# Patient Record
Sex: Female | Born: 1985 | Race: White | Hispanic: No | Marital: Single | Smoking: Never smoker
Health system: Southern US, Community
[De-identification: ages and names within clinical notes are randomized; demographics above are authoritative.]

## PROBLEM LIST (undated history)

## (undated) DIAGNOSIS — J45909 Unspecified asthma, uncomplicated: Secondary | ICD-10-CM

## (undated) DIAGNOSIS — F419 Anxiety disorder, unspecified: Secondary | ICD-10-CM

## (undated) DIAGNOSIS — Z808 Family history of malignant neoplasm of other organs or systems: Secondary | ICD-10-CM

## (undated) DIAGNOSIS — C439 Malignant melanoma of skin, unspecified: Secondary | ICD-10-CM

## (undated) DIAGNOSIS — F32A Depression, unspecified: Secondary | ICD-10-CM

## (undated) DIAGNOSIS — U099 Post covid-19 condition, unspecified: Secondary | ICD-10-CM

## (undated) DIAGNOSIS — F1910 Other psychoactive substance abuse, uncomplicated: Secondary | ICD-10-CM

## (undated) DIAGNOSIS — M3313 Other dermatomyositis without myopathy: Secondary | ICD-10-CM

## (undated) DIAGNOSIS — Z803 Family history of malignant neoplasm of breast: Secondary | ICD-10-CM

## (undated) DIAGNOSIS — J189 Pneumonia, unspecified organism: Secondary | ICD-10-CM

## (undated) HISTORY — DX: Family history of malignant neoplasm of breast: Z80.3

## (undated) HISTORY — DX: Unspecified asthma, uncomplicated: J45.909

## (undated) HISTORY — DX: Family history of malignant neoplasm of other organs or systems: Z80.8

## (undated) HISTORY — DX: Post covid-19 condition, unspecified: U09.9

## (undated) HISTORY — DX: Malignant melanoma of skin, unspecified: C43.9

## (undated) HISTORY — PX: OTHER SURGICAL HISTORY: SHX169

---
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
## 2004-07-24 ENCOUNTER — Other Ambulatory Visit: Admission: RE | Admit: 2004-07-24 | Discharge: 2004-07-24 | Payer: Self-pay | Admitting: Family Medicine

## 2005-08-26 ENCOUNTER — Other Ambulatory Visit: Admission: RE | Admit: 2005-08-26 | Discharge: 2005-08-26 | Payer: Self-pay | Admitting: *Deleted

## 2008-05-21 ENCOUNTER — Other Ambulatory Visit: Admission: RE | Admit: 2008-05-21 | Discharge: 2008-05-21 | Payer: Self-pay | Admitting: Family Medicine

## 2010-01-23 ENCOUNTER — Other Ambulatory Visit
Admission: RE | Admit: 2010-01-23 | Discharge: 2010-01-23 | Payer: Self-pay | Source: Home / Self Care | Admitting: Family Medicine

## 2010-01-23 ENCOUNTER — Other Ambulatory Visit: Payer: Self-pay

## 2010-12-22 DIAGNOSIS — M3320 Polymyositis, organ involvement unspecified: Secondary | ICD-10-CM | POA: Insufficient documentation

## 2010-12-22 DIAGNOSIS — C799 Secondary malignant neoplasm of unspecified site: Secondary | ICD-10-CM | POA: Insufficient documentation

## 2015-07-31 DIAGNOSIS — I890 Lymphedema, not elsewhere classified: Secondary | ICD-10-CM | POA: Insufficient documentation

## 2015-07-31 DIAGNOSIS — E8989 Other postprocedural endocrine and metabolic complications and disorders: Secondary | ICD-10-CM | POA: Insufficient documentation

## 2015-07-31 DIAGNOSIS — M3390 Dermatopolymyositis, unspecified, organ involvement unspecified: Secondary | ICD-10-CM | POA: Insufficient documentation

## 2015-07-31 DIAGNOSIS — R29898 Other symptoms and signs involving the musculoskeletal system: Secondary | ICD-10-CM | POA: Insufficient documentation

## 2015-09-12 DIAGNOSIS — M3390 Dermatopolymyositis, unspecified, organ involvement unspecified: Secondary | ICD-10-CM | POA: Diagnosis not present

## 2015-09-12 DIAGNOSIS — I890 Lymphedema, not elsewhere classified: Secondary | ICD-10-CM | POA: Diagnosis not present

## 2015-09-12 DIAGNOSIS — Z5181 Encounter for therapeutic drug level monitoring: Secondary | ICD-10-CM | POA: Diagnosis not present

## 2015-09-12 DIAGNOSIS — R296 Repeated falls: Secondary | ICD-10-CM | POA: Diagnosis not present

## 2015-09-12 DIAGNOSIS — Z85820 Personal history of malignant melanoma of skin: Secondary | ICD-10-CM | POA: Insufficient documentation

## 2015-09-12 DIAGNOSIS — Z7689 Persons encountering health services in other specified circumstances: Secondary | ICD-10-CM | POA: Diagnosis not present

## 2015-09-22 DIAGNOSIS — M3320 Polymyositis, organ involvement unspecified: Secondary | ICD-10-CM | POA: Diagnosis not present

## 2015-10-06 DIAGNOSIS — M3390 Dermatopolymyositis, unspecified, organ involvement unspecified: Secondary | ICD-10-CM | POA: Diagnosis not present

## 2015-10-06 DIAGNOSIS — M3320 Polymyositis, organ involvement unspecified: Secondary | ICD-10-CM | POA: Diagnosis not present

## 2015-10-15 DIAGNOSIS — R2689 Other abnormalities of gait and mobility: Secondary | ICD-10-CM | POA: Diagnosis not present

## 2015-10-15 DIAGNOSIS — R531 Weakness: Secondary | ICD-10-CM | POA: Diagnosis not present

## 2015-10-15 DIAGNOSIS — I890 Lymphedema, not elsewhere classified: Secondary | ICD-10-CM | POA: Diagnosis not present

## 2015-10-15 DIAGNOSIS — M3320 Polymyositis, organ involvement unspecified: Secondary | ICD-10-CM | POA: Diagnosis not present

## 2015-10-16 DIAGNOSIS — I890 Lymphedema, not elsewhere classified: Secondary | ICD-10-CM | POA: Diagnosis not present

## 2015-10-16 DIAGNOSIS — M3320 Polymyositis, organ involvement unspecified: Secondary | ICD-10-CM | POA: Diagnosis not present

## 2015-10-16 DIAGNOSIS — R2689 Other abnormalities of gait and mobility: Secondary | ICD-10-CM | POA: Diagnosis not present

## 2015-10-16 DIAGNOSIS — R531 Weakness: Secondary | ICD-10-CM | POA: Diagnosis not present

## 2015-10-27 DIAGNOSIS — I890 Lymphedema, not elsewhere classified: Secondary | ICD-10-CM | POA: Diagnosis not present

## 2015-10-27 DIAGNOSIS — R531 Weakness: Secondary | ICD-10-CM | POA: Diagnosis not present

## 2015-10-27 DIAGNOSIS — R2689 Other abnormalities of gait and mobility: Secondary | ICD-10-CM | POA: Diagnosis not present

## 2015-10-27 DIAGNOSIS — M3320 Polymyositis, organ involvement unspecified: Secondary | ICD-10-CM | POA: Diagnosis not present

## 2015-11-13 DIAGNOSIS — M3320 Polymyositis, organ involvement unspecified: Secondary | ICD-10-CM | POA: Diagnosis not present

## 2015-11-13 DIAGNOSIS — R262 Difficulty in walking, not elsewhere classified: Secondary | ICD-10-CM | POA: Diagnosis not present

## 2015-11-13 DIAGNOSIS — R2689 Other abnormalities of gait and mobility: Secondary | ICD-10-CM | POA: Diagnosis not present

## 2015-11-13 DIAGNOSIS — R531 Weakness: Secondary | ICD-10-CM | POA: Diagnosis not present

## 2015-11-17 DIAGNOSIS — R531 Weakness: Secondary | ICD-10-CM | POA: Diagnosis not present

## 2015-11-17 DIAGNOSIS — Z79899 Other long term (current) drug therapy: Secondary | ICD-10-CM | POA: Diagnosis not present

## 2015-11-17 DIAGNOSIS — Z23 Encounter for immunization: Secondary | ICD-10-CM | POA: Diagnosis not present

## 2015-11-17 DIAGNOSIS — M3390 Dermatopolymyositis, unspecified, organ involvement unspecified: Secondary | ICD-10-CM | POA: Diagnosis not present

## 2015-11-17 DIAGNOSIS — R899 Unspecified abnormal finding in specimens from other organs, systems and tissues: Secondary | ICD-10-CM | POA: Diagnosis not present

## 2015-11-17 DIAGNOSIS — I890 Lymphedema, not elsewhere classified: Secondary | ICD-10-CM | POA: Diagnosis not present

## 2015-11-18 ENCOUNTER — Telehealth: Payer: Self-pay | Admitting: Internal Medicine

## 2015-11-18 NOTE — Telephone Encounter (Signed)
LVM to inform patient. She could call back and set something with another doctor.

## 2015-11-18 NOTE — Telephone Encounter (Signed)
Patient is requesting to become a patient of yours. Please advise. Thank you.  °

## 2015-11-18 NOTE — Telephone Encounter (Signed)
Unfortunately I am not taking new patients right now. There are other providers at our office who are.

## 2015-11-19 DIAGNOSIS — M3320 Polymyositis, organ involvement unspecified: Secondary | ICD-10-CM | POA: Diagnosis not present

## 2015-11-19 DIAGNOSIS — R262 Difficulty in walking, not elsewhere classified: Secondary | ICD-10-CM | POA: Diagnosis not present

## 2015-11-19 DIAGNOSIS — R2689 Other abnormalities of gait and mobility: Secondary | ICD-10-CM | POA: Diagnosis not present

## 2015-11-19 DIAGNOSIS — R531 Weakness: Secondary | ICD-10-CM | POA: Diagnosis not present

## 2015-12-02 DIAGNOSIS — M3320 Polymyositis, organ involvement unspecified: Secondary | ICD-10-CM | POA: Diagnosis not present

## 2015-12-02 DIAGNOSIS — R531 Weakness: Secondary | ICD-10-CM | POA: Diagnosis not present

## 2015-12-02 DIAGNOSIS — R262 Difficulty in walking, not elsewhere classified: Secondary | ICD-10-CM | POA: Diagnosis not present

## 2015-12-02 DIAGNOSIS — R2689 Other abnormalities of gait and mobility: Secondary | ICD-10-CM | POA: Diagnosis not present

## 2015-12-09 DIAGNOSIS — Z85820 Personal history of malignant melanoma of skin: Secondary | ICD-10-CM | POA: Diagnosis not present

## 2015-12-09 DIAGNOSIS — M3390 Dermatopolymyositis, unspecified, organ involvement unspecified: Secondary | ICD-10-CM | POA: Diagnosis not present

## 2015-12-09 DIAGNOSIS — M3320 Polymyositis, organ involvement unspecified: Secondary | ICD-10-CM | POA: Diagnosis not present

## 2015-12-11 DIAGNOSIS — R2689 Other abnormalities of gait and mobility: Secondary | ICD-10-CM | POA: Diagnosis not present

## 2015-12-11 DIAGNOSIS — R531 Weakness: Secondary | ICD-10-CM | POA: Diagnosis not present

## 2015-12-11 DIAGNOSIS — R262 Difficulty in walking, not elsewhere classified: Secondary | ICD-10-CM | POA: Diagnosis not present

## 2015-12-11 DIAGNOSIS — M3320 Polymyositis, organ involvement unspecified: Secondary | ICD-10-CM | POA: Diagnosis not present

## 2015-12-15 DIAGNOSIS — R262 Difficulty in walking, not elsewhere classified: Secondary | ICD-10-CM | POA: Diagnosis not present

## 2015-12-15 DIAGNOSIS — R899 Unspecified abnormal finding in specimens from other organs, systems and tissues: Secondary | ICD-10-CM | POA: Diagnosis not present

## 2015-12-15 DIAGNOSIS — R2689 Other abnormalities of gait and mobility: Secondary | ICD-10-CM | POA: Diagnosis not present

## 2015-12-15 DIAGNOSIS — R531 Weakness: Secondary | ICD-10-CM | POA: Diagnosis not present

## 2015-12-15 DIAGNOSIS — R29898 Other symptoms and signs involving the musculoskeletal system: Secondary | ICD-10-CM | POA: Diagnosis not present

## 2015-12-26 DIAGNOSIS — R531 Weakness: Secondary | ICD-10-CM | POA: Diagnosis not present

## 2015-12-26 DIAGNOSIS — R2689 Other abnormalities of gait and mobility: Secondary | ICD-10-CM | POA: Diagnosis not present

## 2015-12-26 DIAGNOSIS — R262 Difficulty in walking, not elsewhere classified: Secondary | ICD-10-CM | POA: Diagnosis not present

## 2015-12-26 DIAGNOSIS — R29898 Other symptoms and signs involving the musculoskeletal system: Secondary | ICD-10-CM | POA: Diagnosis not present

## 2015-12-29 DIAGNOSIS — F4323 Adjustment disorder with mixed anxiety and depressed mood: Secondary | ICD-10-CM | POA: Diagnosis not present

## 2015-12-29 DIAGNOSIS — R531 Weakness: Secondary | ICD-10-CM | POA: Diagnosis not present

## 2015-12-29 DIAGNOSIS — R2689 Other abnormalities of gait and mobility: Secondary | ICD-10-CM | POA: Diagnosis not present

## 2015-12-29 DIAGNOSIS — R262 Difficulty in walking, not elsewhere classified: Secondary | ICD-10-CM | POA: Diagnosis not present

## 2015-12-29 DIAGNOSIS — R29898 Other symptoms and signs involving the musculoskeletal system: Secondary | ICD-10-CM | POA: Diagnosis not present

## 2016-01-02 DIAGNOSIS — R29898 Other symptoms and signs involving the musculoskeletal system: Secondary | ICD-10-CM | POA: Diagnosis not present

## 2016-01-02 DIAGNOSIS — R531 Weakness: Secondary | ICD-10-CM | POA: Diagnosis not present

## 2016-01-02 DIAGNOSIS — R2689 Other abnormalities of gait and mobility: Secondary | ICD-10-CM | POA: Diagnosis not present

## 2016-01-02 DIAGNOSIS — R262 Difficulty in walking, not elsewhere classified: Secondary | ICD-10-CM | POA: Diagnosis not present

## 2016-01-08 DIAGNOSIS — M3320 Polymyositis, organ involvement unspecified: Secondary | ICD-10-CM | POA: Diagnosis not present

## 2016-01-08 DIAGNOSIS — F4323 Adjustment disorder with mixed anxiety and depressed mood: Secondary | ICD-10-CM | POA: Diagnosis not present

## 2016-01-08 DIAGNOSIS — C799 Secondary malignant neoplasm of unspecified site: Secondary | ICD-10-CM | POA: Diagnosis not present

## 2016-01-09 DIAGNOSIS — R29898 Other symptoms and signs involving the musculoskeletal system: Secondary | ICD-10-CM | POA: Diagnosis not present

## 2016-01-09 DIAGNOSIS — R262 Difficulty in walking, not elsewhere classified: Secondary | ICD-10-CM | POA: Diagnosis not present

## 2016-01-09 DIAGNOSIS — R2689 Other abnormalities of gait and mobility: Secondary | ICD-10-CM | POA: Diagnosis not present

## 2016-01-09 DIAGNOSIS — R531 Weakness: Secondary | ICD-10-CM | POA: Diagnosis not present

## 2016-01-19 DIAGNOSIS — R262 Difficulty in walking, not elsewhere classified: Secondary | ICD-10-CM | POA: Diagnosis not present

## 2016-01-19 DIAGNOSIS — R2689 Other abnormalities of gait and mobility: Secondary | ICD-10-CM | POA: Diagnosis not present

## 2016-01-19 DIAGNOSIS — F4323 Adjustment disorder with mixed anxiety and depressed mood: Secondary | ICD-10-CM | POA: Diagnosis not present

## 2016-01-19 DIAGNOSIS — M3320 Polymyositis, organ involvement unspecified: Secondary | ICD-10-CM | POA: Diagnosis not present

## 2016-01-19 DIAGNOSIS — R29898 Other symptoms and signs involving the musculoskeletal system: Secondary | ICD-10-CM | POA: Diagnosis not present

## 2016-01-22 ENCOUNTER — Other Ambulatory Visit (INDEPENDENT_AMBULATORY_CARE_PROVIDER_SITE_OTHER): Payer: BLUE CROSS/BLUE SHIELD

## 2016-01-22 ENCOUNTER — Encounter: Payer: Self-pay | Admitting: Internal Medicine

## 2016-01-22 ENCOUNTER — Ambulatory Visit (INDEPENDENT_AMBULATORY_CARE_PROVIDER_SITE_OTHER): Payer: BLUE CROSS/BLUE SHIELD | Admitting: Internal Medicine

## 2016-01-22 VITALS — BP 118/84 | HR 74 | Temp 98.6°F | Resp 16 | Ht 66.0 in | Wt 178.0 lb

## 2016-01-22 DIAGNOSIS — M3320 Polymyositis, organ involvement unspecified: Secondary | ICD-10-CM

## 2016-01-22 DIAGNOSIS — Z0000 Encounter for general adult medical examination without abnormal findings: Secondary | ICD-10-CM | POA: Diagnosis not present

## 2016-01-22 DIAGNOSIS — C439 Malignant melanoma of skin, unspecified: Secondary | ICD-10-CM

## 2016-01-22 DIAGNOSIS — Z114 Encounter for screening for human immunodeficiency virus [HIV]: Secondary | ICD-10-CM | POA: Diagnosis not present

## 2016-01-22 DIAGNOSIS — R29898 Other symptoms and signs involving the musculoskeletal system: Secondary | ICD-10-CM | POA: Diagnosis not present

## 2016-01-22 DIAGNOSIS — C799 Secondary malignant neoplasm of unspecified site: Secondary | ICD-10-CM

## 2016-01-22 DIAGNOSIS — Z23 Encounter for immunization: Secondary | ICD-10-CM

## 2016-01-22 DIAGNOSIS — R262 Difficulty in walking, not elsewhere classified: Secondary | ICD-10-CM | POA: Diagnosis not present

## 2016-01-22 DIAGNOSIS — E8989 Other postprocedural endocrine and metabolic complications and disorders: Secondary | ICD-10-CM

## 2016-01-22 DIAGNOSIS — B379 Candidiasis, unspecified: Secondary | ICD-10-CM

## 2016-01-22 DIAGNOSIS — I890 Lymphedema, not elsewhere classified: Secondary | ICD-10-CM

## 2016-01-22 DIAGNOSIS — R2689 Other abnormalities of gait and mobility: Secondary | ICD-10-CM | POA: Diagnosis not present

## 2016-01-22 LAB — LIPID PANEL
CHOLESTEROL: 184 mg/dL (ref 0–200)
HDL: 90.8 mg/dL (ref >39.00)
LDL CALC: 82 mg/dL (ref 0–99)
NonHDL: 93.63
Total CHOL/HDL Ratio: 2
Triglycerides: 60 mg/dL (ref 0.0–149.0)
VLDL: 12 mg/dL (ref 0.0–40.0)

## 2016-01-22 LAB — COMPREHENSIVE METABOLIC PANEL
ALBUMIN: 4.5 g/dL (ref 3.5–5.2)
ALT: 22 U/L (ref 0–35)
AST: 19 U/L (ref 0–37)
Alkaline Phosphatase: 33 U/L — ABNORMAL LOW (ref 39–117)
BUN: 10 mg/dL (ref 6–23)
CALCIUM: 9.7 mg/dL (ref 8.4–10.5)
CHLORIDE: 103 meq/L (ref 96–112)
CO2: 29 mEq/L (ref 19–32)
Creatinine, Ser: 0.43 mg/dL (ref 0.40–1.20)
GFR: 180.88 mL/min (ref >60.00)
Glucose, Bld: 86 mg/dL (ref 70–99)
POTASSIUM: 3.5 meq/L (ref 3.5–5.1)
SODIUM: 140 meq/L (ref 135–145)
Total Bilirubin: 0.5 mg/dL (ref 0.2–1.2)
Total Protein: 7.2 g/dL (ref 6.0–8.3)

## 2016-01-22 LAB — CBC WITH DIFFERENTIAL/PLATELET
BASOS PCT: 0.2 % (ref 0.0–3.0)
Basophils Absolute: 0 10*3/uL (ref 0.0–0.1)
EOS PCT: 0.2 % (ref 0.0–5.0)
Eosinophils Absolute: 0 10*3/uL (ref 0.0–0.7)
HCT: 38.5 % (ref 36.0–46.0)
HEMOGLOBIN: 13.2 g/dL (ref 12.0–15.0)
Lymphocytes Relative: 10.9 % — ABNORMAL LOW (ref 12.0–46.0)
Lymphs Abs: 1.6 10*3/uL (ref 0.7–4.0)
MCHC: 34.3 g/dL (ref 30.0–36.0)
MCV: 84.8 fl (ref 78.0–100.0)
MONO ABS: 0.6 10*3/uL (ref 0.1–1.0)
Monocytes Relative: 4.5 % (ref 3.0–12.0)
Neutro Abs: 12 10*3/uL — ABNORMAL HIGH (ref 1.4–7.7)
Neutrophils Relative %: 84.2 % — ABNORMAL HIGH (ref 43.0–77.0)
Platelets: 295 10*3/uL (ref 150.0–400.0)
RBC: 4.53 Mil/uL (ref 3.87–5.11)
RDW: 13.8 % (ref 11.5–15.5)
WBC: 14.2 10*3/uL — AB (ref 4.0–10.5)

## 2016-01-22 LAB — TSH: TSH: 2.36 u[IU]/mL (ref 0.35–4.50)

## 2016-01-22 MED ORDER — PROBIOTIC ACIDOPHILUS PO CAPS: 1.0000 | ORAL_CAPSULE | Freq: Every day | ORAL | Status: AC

## 2016-01-22 NOTE — Assessment & Plan Note (Signed)
Related to lymph node removal for melanoma Following at [facility] lymphedema clinic

## 2016-01-22 NOTE — Assessment & Plan Note (Signed)
Recurrent infections Will discuss with derm when she sees them next month Will likely need diflucan for prophylaxis Working on a candidiasis diet

## 2016-01-22 NOTE — Assessment & Plan Note (Signed)
Following with [facility] neurology On prednisone - dose being tapered slowly Started Rituxan August 2017 Currently on prednisone 30 mg daily Doing PT - water therapy at [facility]

## 2016-01-22 NOTE — Progress Notes (Signed)
Pre visit review using our clinic review tool, if applicable. No additional management support is needed unless otherwise documented below in the visit note. 

## 2016-01-22 NOTE — Patient Instructions (Addendum)
[facility] [address] [place], [place] [address] [place]  [place], [place]  [address] [place], [place] [address] [phone]    Test(s) ordered today. Your results will be released to [place] (or called to you) after review, usually within 72hours after test completion. If any changes need to be made, you will be notified at that same time.  All other Health Maintenance issues reviewed.   All recommended immunizations and age-appropriate screenings are up-to-date or discussed.  tdap immunization administered today.   Medications reviewed and updated.   No changes recommended at this time.    Please followup in one year, sooner if needed

## 2016-01-22 NOTE — Progress Notes (Signed)
Subjective:    Patient ID: Patricia Blevins, female    DOB: 1985-12-17, 31 y.o.   MRN: [id number]  HPI She is here to establish with a new pcp.   She is here for a physical exam.    She moved back to the area in July 2017 - she grew up here.    Dermatomyositis/ polymyositis:  Diagnosed at age 23. Unclear if it is dermatomyositis or polymyositis.  Has been on prednisone since then.  She did IVIG as a teenager, but had severe headaches and did not work well.  She was on imuran and other immunosuppressants.  She is doing Rituxan injections - has only had one injection (August 2017) - was on 50 mg on prednisone and has been able to taper down.  She is currently following with [facility].  She is on 30 mg of prednisone daily. She has weakness in her proximal muscles.    Melanoma:  She was diagnosed around 2008-2010 - it was in her shoulder blades.  She had her lymph nodes removed in her right axilla and som in the left axilla.   She has lymphedema in her right arm.  It is always there.  She did PT in [place].  She has a pump at home and has not been using it daily.  She did not tolerate the sleeve.  She [facility] PT [facility] is [facility] therapy.  She has mild lymphedema in left arm. She will see derm next month.  There has been no evidence of recurrence.    She gets yeast infections/UTIs after sex, no matter what she does.   What she was getting in [place] was not helping - she was becoming resistent.  She saw ID in [place].  She has not had an recent infections. .   She has had intermittent yeast infections.   Working on an anti-inflammatory diet and low yeast diet.     She is doing PT 2/week at [facility] - mainly water PT.  She denies pain. She has significant weakness in her proximal muscles - she does feel her weakness is improving since starting PT.       Medications and allergies reviewed with patient and updated if appropriate.  Patient Active Problem List   Diagnosis Date  Noted  . Candidiasis 01/22/2016  . History of melanoma 09/12/2015  . Lymphedema of upper extremity following lymphadenectomy 07/31/2015  . Leg weakness, bilateral 07/31/2015  . Metastatic melanoma ([place]) 12/22/2010  . PM (polymyositis) ([place]) 12/22/2010    No current outpatient prescriptions on file prior to visit.   No current facility-administered medications on file prior to visit.     Past Medical History:  Diagnosis Date  . Asthma   . Melanoma [facility])     Past Surgical History:  Procedure Laterality Date  . lymph node removal    . melanoma removal  2008-2009    Social History   Social History  . Marital status: Single    Spouse name: N/A  . Number of children: N/A  . Years of education: N/A   Social History Main Topics  . Smoking status: Never Smoker  . Smokeless tobacco: Never Used  . Alcohol use Yes     Comment: occ  . Drug use: No     Comment: in 20's used marijuana and cocaine  . Sexual activity: Not Asked   Other Topics Concern  . None   Social History Narrative  . None  Family History  Problem Relation Age of Onset  . Arthritis Mother   . Hypertension Mother   . Arthritis Father   . Hypertension Brother   . Arthritis Maternal Grandmother   . Stroke Maternal Grandmother   . Arthritis Maternal Grandfather   . Melanoma Maternal Grandfather   . Cancer Paternal Grandmother     breast  . Diabetes Paternal Grandmother     Review of Systems  Constitutional: Negative for chills and fever.  HENT: Negative for hearing loss.   Eyes: Negative for visual disturbance.  Respiratory: Negative for cough, shortness of breath and wheezing.   Cardiovascular: Negative for chest pain, palpitations and leg swelling.  Gastrointestinal: Negative for abdominal pain, constipation, diarrhea and nausea.       No gerd  Genitourinary: Negative for dysuria and hematuria.  Musculoskeletal: Positive for arthralgias (right hip). Negative for back pain and myalgias.        Muscle weakness - proximal  Skin: Positive for rash (intermittent - yeast rashes).  Neurological: Positive for light-headedness. Negative for dizziness and headaches.  Psychiatric/Behavioral: Negative for dysphoric mood. The patient is not nervous/anxious.        Objective:   Vitals:   01/22/16 0902  BP: 118/84  Pulse: 74  Resp: 16  Temp: 98.6 F (37 C)   Filed Weights   01/22/16 0902  Weight: 178 lb (80.7 kg)   Body mass index is 28.73 kg/m.  Wt Readings from Last 3 Encounters:  01/22/16 178 lb (80.7 kg)     Physical Exam Constitutional: She appears well-developed and well-nourished. No distress.  HENT:  Head: Normocephalic and atraumatic.  Right Ear: External ear normal. Normal ear canal and TM Left Ear: External ear normal.  Normal ear canal and TM Mouth/Throat: Oropharynx is clear and moist.  Eyes: Conjunctivae and EOM are normal.  Neck: Neck supple. No tracheal deviation present. No thyromegaly present.  No carotid bruit  Cardiovascular: Normal rate, regular rhythm and normal heart sounds.   No murmur heard.  No edema. Pulmonary/Chest: Effort normal and breath sounds normal. No respiratory distress. She has no wheezes. She has no rales.  Breast: deferred to Gyn Abdominal: Soft. She exhibits no distension. There is no tenderness.  Msk: muscle weakness in proximal muscles of arms/legs Lymphadenopathy: She has no cervical adenopathy.  Skin: Skin is warm and dry. She is not diaphoretic.  Psychiatric: She has a normal mood and affect. Her behavior is normal.         Assessment & Plan:   Physical exam: Screening blood work ordered Immunizations tdap today, had flu vaccine Gyn - has not seen one in 3 years - will give info for new gyn Eye exams  Up to date  Exercise  - doing PT - working on increasing activity Weight - overweight, but limited in activity Skin rashes on face and body - suggestive of candidiasis - intermittent - sees derm next week - will  discuss with them Substance abuse - none  See Problem List for Assessment and Plan of chronic medical problems.   FU annually, sooner if needed

## 2016-01-22 NOTE — Assessment & Plan Note (Signed)
Sees dermatology No evidence of recurrence

## 2016-01-23 LAB — HIV ANTIBODY (ROUTINE TESTING W REFLEX): HIV: NONREACTIVE

## 2016-01-26 ENCOUNTER — Encounter: Payer: Self-pay | Admitting: Obstetrics and Gynecology

## 2016-01-26 ENCOUNTER — Encounter: Payer: BLUE CROSS/BLUE SHIELD | Admitting: Obstetrics and Gynecology

## 2016-02-02 DIAGNOSIS — Z79899 Other long term (current) drug therapy: Secondary | ICD-10-CM | POA: Diagnosis not present

## 2016-02-02 DIAGNOSIS — M3390 Dermatopolymyositis, unspecified, organ involvement unspecified: Secondary | ICD-10-CM | POA: Diagnosis not present

## 2016-02-04 DIAGNOSIS — F4323 Adjustment disorder with mixed anxiety and depressed mood: Secondary | ICD-10-CM | POA: Diagnosis not present

## 2016-02-06 DIAGNOSIS — M3320 Polymyositis, organ involvement unspecified: Secondary | ICD-10-CM | POA: Diagnosis not present

## 2016-02-06 DIAGNOSIS — Z713 Dietary counseling and surveillance: Secondary | ICD-10-CM | POA: Diagnosis not present

## 2016-02-06 DIAGNOSIS — Z85820 Personal history of malignant melanoma of skin: Secondary | ICD-10-CM | POA: Diagnosis not present

## 2016-02-06 DIAGNOSIS — R2689 Other abnormalities of gait and mobility: Secondary | ICD-10-CM | POA: Diagnosis not present

## 2016-02-06 DIAGNOSIS — R262 Difficulty in walking, not elsewhere classified: Secondary | ICD-10-CM | POA: Diagnosis not present

## 2016-02-06 DIAGNOSIS — R29898 Other symptoms and signs involving the musculoskeletal system: Secondary | ICD-10-CM | POA: Diagnosis not present

## 2016-02-09 DIAGNOSIS — M3320 Polymyositis, organ involvement unspecified: Secondary | ICD-10-CM | POA: Diagnosis not present

## 2016-02-09 DIAGNOSIS — R29898 Other symptoms and signs involving the musculoskeletal system: Secondary | ICD-10-CM | POA: Diagnosis not present

## 2016-02-09 DIAGNOSIS — R2689 Other abnormalities of gait and mobility: Secondary | ICD-10-CM | POA: Diagnosis not present

## 2016-02-09 DIAGNOSIS — R262 Difficulty in walking, not elsewhere classified: Secondary | ICD-10-CM | POA: Diagnosis not present

## 2016-02-11 DIAGNOSIS — Z124 Encounter for screening for malignant neoplasm of cervix: Secondary | ICD-10-CM | POA: Diagnosis not present

## 2016-02-11 DIAGNOSIS — N760 Acute vaginitis: Secondary | ICD-10-CM | POA: Diagnosis not present

## 2016-02-11 DIAGNOSIS — Z1151 Encounter for screening for human papillomavirus (HPV): Secondary | ICD-10-CM | POA: Diagnosis not present

## 2016-02-11 DIAGNOSIS — Z1389 Encounter for screening for other disorder: Secondary | ICD-10-CM | POA: Diagnosis not present

## 2016-02-11 DIAGNOSIS — Z01419 Encounter for gynecological examination (general) (routine) without abnormal findings: Secondary | ICD-10-CM | POA: Diagnosis not present

## 2016-02-11 DIAGNOSIS — Z6827 Body mass index (BMI) 27.0-27.9, adult: Secondary | ICD-10-CM | POA: Diagnosis not present

## 2016-02-11 DIAGNOSIS — R87610 Atypical squamous cells of undetermined significance on cytologic smear of cervix (ASC-US): Secondary | ICD-10-CM | POA: Diagnosis not present

## 2016-02-11 DIAGNOSIS — N898 Other specified noninflammatory disorders of vagina: Secondary | ICD-10-CM | POA: Diagnosis not present

## 2016-02-13 DIAGNOSIS — F4323 Adjustment disorder with mixed anxiety and depressed mood: Secondary | ICD-10-CM | POA: Diagnosis not present

## 2016-02-18 DIAGNOSIS — F4323 Adjustment disorder with mixed anxiety and depressed mood: Secondary | ICD-10-CM | POA: Diagnosis not present

## 2016-02-24 DIAGNOSIS — F4323 Adjustment disorder with mixed anxiety and depressed mood: Secondary | ICD-10-CM | POA: Diagnosis not present

## 2016-02-25 ENCOUNTER — Encounter: Payer: Self-pay | Admitting: Family Medicine

## 2016-02-25 ENCOUNTER — Ambulatory Visit (INDEPENDENT_AMBULATORY_CARE_PROVIDER_SITE_OTHER): Payer: BLUE CROSS/BLUE SHIELD | Admitting: Family Medicine

## 2016-02-25 VITALS — BP 137/83 | HR 85 | Temp 98.7°F | Resp 16 | Ht 66.0 in | Wt 178.0 lb

## 2016-02-25 DIAGNOSIS — Z30019 Encounter for initial prescription of contraceptives, unspecified: Secondary | ICD-10-CM | POA: Diagnosis not present

## 2016-02-25 DIAGNOSIS — M3320 Polymyositis, organ involvement unspecified: Secondary | ICD-10-CM | POA: Diagnosis not present

## 2016-02-25 LAB — POCT URINE PREGNANCY: Preg Test, Ur: NEGATIVE

## 2016-02-25 MED ORDER — NORETHINDRONE 0.35 MG PO TABS: 1.0000 | ORAL_TABLET | Freq: Every day | ORAL | 11 refills | Status: DC

## 2016-02-25 NOTE — Patient Instructions (Signed)
     IF you received an x-ray today, you will receive an invoice from [facility]. Please contact [facility] at [phone] with questions or concerns regarding your invoice.   IF you received labwork today, you will receive an invoice from [facility]. Please contact [facility] at [phone] with questions or concerns regarding your invoice.   Our billing staff will not be able to assist you with questions regarding bills from these companies.  You will be contacted with the lab results as soon as they are available. The fastest way to get your results is to activate your My Chart account. Instructions are located on the last page of this paperwork. If you have not heard from us regarding the results in 2 weeks, please contact this office.     

## 2016-02-25 NOTE — Progress Notes (Signed)
Chief Complaint  Patient presents with  . Contraception  . Establish Care    HPI   Pt reports that she has polymyositis and has been on prednisone chronically since age 31 She reports that her polymyositis is managed by [facility]  Contraception  Patient's last menstrual period was 01/14/2016. Pt reports that she has tried OCPs in the past but stopped due to changes in weight which could have also been due to prednisone She has considered implant, IUD, nuvaring or pills She reports she is concerned about how it would affect   Past Medical History:  Diagnosis Date  . Asthma   . Melanoma [facility])     Current Outpatient Prescriptions  Medication Sig Dispense Refill  . Calcium Carb-Cholecalciferol (CALCIUM-VITAMIN D) 600-400 MG-UNIT TABS Take by mouth daily.    . Lactobacillus (PROBIOTIC ACIDOPHILUS) CAPS Take 1 capsule by mouth daily.    . predniSONE (DELTASONE) 10 MG tablet Take 30 mg by mouth daily with breakfast.    . norethindrone (ORTHO MICRONOR) 0.35 MG tablet Take 1 tablet (0.35 mg total) by mouth daily. 1 Package 11   No current facility-administered medications for this visit.     Allergies: No Known Allergies  Past Surgical History:  Procedure Laterality Date  . lymph node removal    . melanoma removal  2008-2009    Social History   Social History  . Marital status: Single    Spouse name: N/A  . Number of children: N/A  . Years of education: N/A   Social History Main Topics  . Smoking status: Never Smoker  . Smokeless tobacco: Never Used  . Alcohol use Yes     Comment: occ  . Drug use: No     Comment: in 20's used marijuana and cocaine  . Sexual activity: Not Asked   Other Topics Concern  . None   Social History Narrative  . None    Review of Systems  Constitutional: Negative for chills and fever.  Respiratory: Negative for cough, shortness of breath and wheezing.   Cardiovascular: Negative for chest pain, palpitations and orthopnea.    Musculoskeletal: Positive for back pain, joint pain and myalgias.  Skin: Negative for itching and rash.  Neurological: Positive for headaches. Negative for dizziness and tingling.       Has sinus headaches    Objective: Vitals:   02/25/16 0855  BP: 137/83  Pulse: 85  Resp: 16  Temp: 98.7 F (37.1 C)  TempSrc: Oral  SpO2: 100%  Weight: 178 lb (80.7 kg)  Height: 5\' 6"  (1.676 m)  Body mass index is 28.73 kg/m.   Physical Exam  Constitutional: She is oriented to person, place, and time. She appears well-developed and well-nourished.  HENT:  Head: Normocephalic and atraumatic.  Eyes: Conjunctivae and EOM are normal.  Neck: Normal range of motion. Neck supple.  Cardiovascular: Normal rate, regular rhythm and normal heart sounds.   Pulmonary/Chest: Effort normal and breath sounds normal. No respiratory distress.  Neurological: She is alert and oriented to person, place, and time.  Skin: Skin is warm. No erythema.  Psychiatric: She has a normal mood and affect. Her behavior is normal. Judgment and thought content normal.    Assessment and Duarte was seen today for contraception and establish care.  Diagnoses and all orders for this visit:  Encounter for initial prescription of contraceptives, unspecified contraceptive -     POCT urine pregnancy -     norethindrone (ORTHO MICRONOR) 0.35 MG tablet; Take 1  tablet (0.35 mg total) by mouth daily.  Pt advised to follow up with Janann August for her IUD but for now sent her micronor  Polymyositis [facility]) For her polymyositis continue at [facility]

## 2016-03-05 DIAGNOSIS — D2270 Melanocytic nevi of unspecified lower limb, including hip: Secondary | ICD-10-CM

## 2016-03-05 DIAGNOSIS — L710 Perioral dermatitis: Secondary | ICD-10-CM | POA: Diagnosis not present

## 2016-03-05 DIAGNOSIS — Z7960 Long term (current) use of unspecified immunomodulators and immunosuppressants: Secondary | ICD-10-CM | POA: Insufficient documentation

## 2016-03-05 DIAGNOSIS — Z79899 Other long term (current) drug therapy: Secondary | ICD-10-CM | POA: Diagnosis not present

## 2016-03-05 DIAGNOSIS — D225 Melanocytic nevi of trunk: Secondary | ICD-10-CM | POA: Insufficient documentation

## 2016-03-05 DIAGNOSIS — D2260 Melanocytic nevi of unspecified upper limb, including shoulder: Secondary | ICD-10-CM

## 2016-03-05 DIAGNOSIS — D1801 Hemangioma of skin and subcutaneous tissue: Secondary | ICD-10-CM | POA: Diagnosis not present

## 2016-03-19 DIAGNOSIS — R739 Hyperglycemia, unspecified: Secondary | ICD-10-CM | POA: Diagnosis not present

## 2016-03-19 DIAGNOSIS — Z79899 Other long term (current) drug therapy: Secondary | ICD-10-CM | POA: Diagnosis not present

## 2016-03-19 DIAGNOSIS — M3390 Dermatopolymyositis, unspecified, organ involvement unspecified: Secondary | ICD-10-CM | POA: Diagnosis not present

## 2016-03-19 DIAGNOSIS — T50905A Adverse effect of unspecified drugs, medicaments and biological substances, initial encounter: Secondary | ICD-10-CM | POA: Diagnosis not present

## 2016-03-19 DIAGNOSIS — M3320 Polymyositis, organ involvement unspecified: Secondary | ICD-10-CM | POA: Diagnosis not present

## 2016-03-23 ENCOUNTER — Telehealth: Payer: Self-pay | Admitting: Internal Medicine

## 2016-03-23 NOTE — Telephone Encounter (Signed)
Rec'd from [facility]DS forward 5 pages to Dr.Burns

## 2016-04-19 DIAGNOSIS — M3390 Dermatopolymyositis, unspecified, organ involvement unspecified: Secondary | ICD-10-CM | POA: Diagnosis not present

## 2016-04-19 DIAGNOSIS — M3320 Polymyositis, organ involvement unspecified: Secondary | ICD-10-CM | POA: Diagnosis not present

## 2016-05-03 DIAGNOSIS — M3320 Polymyositis, organ involvement unspecified: Secondary | ICD-10-CM | POA: Diagnosis not present

## 2016-05-03 DIAGNOSIS — M3390 Dermatopolymyositis, unspecified, organ involvement unspecified: Secondary | ICD-10-CM | POA: Diagnosis not present

## 2016-05-18 ENCOUNTER — Encounter: Payer: Self-pay | Admitting: Family Medicine

## 2016-05-18 ENCOUNTER — Ambulatory Visit (INDEPENDENT_AMBULATORY_CARE_PROVIDER_SITE_OTHER): Payer: BLUE CROSS/BLUE SHIELD | Admitting: Family Medicine

## 2016-05-18 ENCOUNTER — Ambulatory Visit (INDEPENDENT_AMBULATORY_CARE_PROVIDER_SITE_OTHER): Payer: BLUE CROSS/BLUE SHIELD

## 2016-05-18 VITALS — BP 142/90 | HR 83 | Temp 98.4°F | Resp 16 | Ht 66.0 in | Wt 171.8 lb

## 2016-05-18 DIAGNOSIS — R6883 Chills (without fever): Secondary | ICD-10-CM

## 2016-05-18 DIAGNOSIS — R062 Wheezing: Secondary | ICD-10-CM

## 2016-05-18 DIAGNOSIS — Z7960 Long term (current) use of unspecified immunomodulators and immunosuppressants: Secondary | ICD-10-CM

## 2016-05-18 DIAGNOSIS — R05 Cough: Secondary | ICD-10-CM | POA: Diagnosis not present

## 2016-05-18 DIAGNOSIS — M791 Myalgia, unspecified site: Secondary | ICD-10-CM

## 2016-05-18 DIAGNOSIS — Z79899 Other long term (current) drug therapy: Secondary | ICD-10-CM

## 2016-05-18 DIAGNOSIS — R058 Other specified cough: Secondary | ICD-10-CM

## 2016-05-18 MED ORDER — AZITHROMYCIN 250 MG PO TABS: ORAL_TABLET | ORAL | 0 refills | Status: DC

## 2016-05-18 MED ORDER — FLUTICASONE PROPIONATE 50 MCG/ACT NA SUSP: 2.0000 | Freq: Every day | NASAL | 6 refills | Status: DC

## 2016-05-18 NOTE — Patient Instructions (Signed)
     IF you received an x-ray today, you will receive an invoice from [facility]. Please contact [facility] at [phone] with questions or concerns regarding your invoice.   IF you received labwork today, you will receive an invoice from [facility]. Please contact [facility] at [phone] with questions or concerns regarding your invoice.   Our billing staff will not be able to assist you with questions regarding bills from these companies.  You will be contacted with the lab results as soon as they are available. The fastest way to get your results is to activate your My Chart account. Instructions are located on the last page of this paperwork. If you have not heard from [place] regarding the results in 2 weeks, please contact this office.     f

## 2016-05-18 NOTE — Progress Notes (Signed)
Chief Complaint  Patient presents with  . URI    nasal/chest congestion x 3 weeks yellow mucus/phlegm, few chills in the beginning     HPI  On April 23rd she had a retuxan infusions and was having sore throat, congestion She reports that she is coughing up yellow mucus Each day  Her symptoms are about the same  She states that she lives with a one year old who may have gotten her sick She reports myalgias, weakness and muscle pain She reports that she had chills and maybe one night of fevers Patient's last menstrual period was 05/18/2016.  She takes rituxan for polymyositis She states that she got sick almost immediately after her infusion She reports that this is her last infusion  Past Medical History:  Diagnosis Date  . Asthma   . Melanoma [facility])     Current Outpatient Prescriptions  Medication Sig Dispense Refill  . Calcium Carb-Cholecalciferol (CALCIUM-VITAMIN D) 600-400 MG-UNIT TABS Take by mouth daily.    . Lactobacillus (PROBIOTIC ACIDOPHILUS) CAPS Take 1 capsule by mouth daily.    . predniSONE (DELTASONE) 10 MG tablet Take 30 mg by mouth daily with breakfast.    . azithromycin (ZITHROMAX) 250 MG tablet Take 2 tablets today, one tablet each day after 6 tablet 0  . fluticasone (FLONASE) 50 MCG/ACT nasal spray Place 2 sprays into both nostrils daily. 16 g 6  . norethindrone (ORTHO MICRONOR) 0.35 MG tablet Take 1 tablet (0.35 mg total) by mouth daily. (Patient not taking: Reported on 05/18/2016) 1 Package 11   No current facility-administered medications for this visit.     Allergies: No Known Allergies  Past Surgical History:  Procedure Laterality Date  . lymph node removal    . melanoma removal  2008-2009    Social History   Social History  . Marital status: Single    Spouse name: N/A  . Number of children: N/A  . Years of education: N/A   Social History Main Topics  . Smoking status: Never Smoker  . Smokeless tobacco: Never Used  . Alcohol use Yes   Comment: occ  . Drug use: No     Comment: in 20's used marijuana and cocaine  . Sexual activity: Not Asked   Other Topics Concern  . None   Social History Narrative  . None    ROS  Objective: Vitals:   05/18/16 0908  BP: (!) 142/90  Pulse: 83  Resp: 16  Temp: 98.4 F (36.9 C)  TempSrc: Oral  SpO2: 96%  Weight: 171 lb 12.8 oz (77.9 kg)  Height: 5\' 6"  (1.676 m)   BP Readings from Last 3 Encounters:  05/18/16 (!) 142/90  02/25/16 137/83  01/22/16 118/84    Physical Exam General: alert, oriented, in NAD Head: normocephalic, atraumatic, no sinus tenderness Eyes: EOM intact, no scleral icterus or conjunctival injection Ears: TM clear bilaterally Nose: mucosa nonerythematous, nonedematous Throat: no pharyngeal exudate or erythema Lymph: no posterior auricular, submental or cervical lymph adenopathy Heart: normal rate, normal sinus rhythm, no murmurs Lungs: wheezing in the apical lung bilaterally, no crackles, no rhonchi  Independent review of CXR imagies with pt No acute cardiopulm manifestations, no effusions noted   Assessment and Plan [place] was seen today for uri.  Diagnoses and all orders for this visit:  Myalgia Chills -   If this persists return to arrange flu test Clinic does not have flu tests currently -     Cancel: POCT Influenza A/B -  DG Chest 2 View  Productive cough -     DG Chest 2 View  Wheezing Medical decision making Will treat for acute bronchitis Plan for flonase and zpak -     DG Chest 2 View -     fluticasone (FLONASE) 50 MCG/ACT nasal spray; Place 2 sprays into both nostrils daily. -     azithromycin (ZITHROMAX) 250 MG tablet; Take 2 tablets today, one tablet each day after  Long term immunosuppressant use Discussed that if her symptoms do not improve to return to clinic  Follow up prn  [place]

## 2016-06-21 DIAGNOSIS — Z79899 Other long term (current) drug therapy: Secondary | ICD-10-CM | POA: Diagnosis not present

## 2016-06-21 DIAGNOSIS — M3390 Dermatopolymyositis, unspecified, organ involvement unspecified: Secondary | ICD-10-CM | POA: Diagnosis not present

## 2016-06-21 DIAGNOSIS — Z006 Encounter for examination for normal comparison and control in clinical research program: Secondary | ICD-10-CM | POA: Diagnosis not present

## 2016-07-06 DIAGNOSIS — Z87440 Personal history of urinary (tract) infections: Secondary | ICD-10-CM | POA: Diagnosis not present

## 2016-07-06 DIAGNOSIS — R29898 Other symptoms and signs involving the musculoskeletal system: Secondary | ICD-10-CM | POA: Diagnosis not present

## 2016-07-06 DIAGNOSIS — M3320 Polymyositis, organ involvement unspecified: Secondary | ICD-10-CM | POA: Diagnosis not present

## 2016-07-06 DIAGNOSIS — I890 Lymphedema, not elsewhere classified: Secondary | ICD-10-CM | POA: Diagnosis not present

## 2016-07-06 DIAGNOSIS — R262 Difficulty in walking, not elsewhere classified: Secondary | ICD-10-CM | POA: Diagnosis not present

## 2016-07-06 DIAGNOSIS — J45909 Unspecified asthma, uncomplicated: Secondary | ICD-10-CM | POA: Diagnosis not present

## 2016-07-06 DIAGNOSIS — Z85820 Personal history of malignant melanoma of skin: Secondary | ICD-10-CM | POA: Diagnosis not present

## 2016-07-20 DIAGNOSIS — R262 Difficulty in walking, not elsewhere classified: Secondary | ICD-10-CM | POA: Diagnosis not present

## 2016-07-20 DIAGNOSIS — I890 Lymphedema, not elsewhere classified: Secondary | ICD-10-CM | POA: Diagnosis not present

## 2016-07-20 DIAGNOSIS — J45909 Unspecified asthma, uncomplicated: Secondary | ICD-10-CM | POA: Diagnosis not present

## 2016-07-20 DIAGNOSIS — R29898 Other symptoms and signs involving the musculoskeletal system: Secondary | ICD-10-CM | POA: Diagnosis not present

## 2016-07-20 DIAGNOSIS — M3320 Polymyositis, organ involvement unspecified: Secondary | ICD-10-CM | POA: Diagnosis not present

## 2016-07-20 DIAGNOSIS — Z87440 Personal history of urinary (tract) infections: Secondary | ICD-10-CM | POA: Diagnosis not present

## 2016-07-20 DIAGNOSIS — Z85820 Personal history of malignant melanoma of skin: Secondary | ICD-10-CM | POA: Diagnosis not present

## 2016-07-20 DIAGNOSIS — R2689 Other abnormalities of gait and mobility: Secondary | ICD-10-CM | POA: Diagnosis not present

## 2016-08-03 DIAGNOSIS — J45909 Unspecified asthma, uncomplicated: Secondary | ICD-10-CM | POA: Diagnosis not present

## 2016-08-03 DIAGNOSIS — M3320 Polymyositis, organ involvement unspecified: Secondary | ICD-10-CM | POA: Diagnosis not present

## 2016-08-03 DIAGNOSIS — Z85820 Personal history of malignant melanoma of skin: Secondary | ICD-10-CM | POA: Diagnosis not present

## 2016-08-03 DIAGNOSIS — R2689 Other abnormalities of gait and mobility: Secondary | ICD-10-CM | POA: Diagnosis not present

## 2016-08-03 DIAGNOSIS — R29898 Other symptoms and signs involving the musculoskeletal system: Secondary | ICD-10-CM | POA: Diagnosis not present

## 2016-08-03 DIAGNOSIS — I890 Lymphedema, not elsewhere classified: Secondary | ICD-10-CM | POA: Diagnosis not present

## 2016-08-03 DIAGNOSIS — Z87440 Personal history of urinary (tract) infections: Secondary | ICD-10-CM | POA: Diagnosis not present

## 2016-08-03 DIAGNOSIS — R262 Difficulty in walking, not elsewhere classified: Secondary | ICD-10-CM | POA: Diagnosis not present

## 2016-08-16 DIAGNOSIS — M3320 Polymyositis, organ involvement unspecified: Secondary | ICD-10-CM | POA: Diagnosis not present

## 2016-08-16 DIAGNOSIS — R262 Difficulty in walking, not elsewhere classified: Secondary | ICD-10-CM | POA: Diagnosis not present

## 2016-08-16 DIAGNOSIS — I890 Lymphedema, not elsewhere classified: Secondary | ICD-10-CM | POA: Diagnosis not present

## 2016-08-16 DIAGNOSIS — R29898 Other symptoms and signs involving the musculoskeletal system: Secondary | ICD-10-CM | POA: Diagnosis not present

## 2016-08-16 DIAGNOSIS — Z85820 Personal history of malignant melanoma of skin: Secondary | ICD-10-CM | POA: Diagnosis not present

## 2016-08-16 DIAGNOSIS — J45909 Unspecified asthma, uncomplicated: Secondary | ICD-10-CM | POA: Diagnosis not present

## 2016-08-16 DIAGNOSIS — Z87440 Personal history of urinary (tract) infections: Secondary | ICD-10-CM | POA: Diagnosis not present

## 2016-08-19 DIAGNOSIS — M3320 Polymyositis, organ involvement unspecified: Secondary | ICD-10-CM | POA: Diagnosis not present

## 2016-09-06 DIAGNOSIS — Z23 Encounter for immunization: Secondary | ICD-10-CM | POA: Diagnosis not present

## 2016-10-13 ENCOUNTER — Encounter: Payer: Self-pay | Admitting: Family Medicine

## 2016-10-13 ENCOUNTER — Ambulatory Visit (INDEPENDENT_AMBULATORY_CARE_PROVIDER_SITE_OTHER): Payer: BLUE CROSS/BLUE SHIELD | Admitting: Family Medicine

## 2016-10-13 VITALS — BP 104/68 | HR 95 | Temp 98.3°F | Resp 17 | Ht 66.0 in | Wt 178.2 lb

## 2016-10-13 DIAGNOSIS — J029 Acute pharyngitis, unspecified: Secondary | ICD-10-CM

## 2016-10-13 DIAGNOSIS — J069 Acute upper respiratory infection, unspecified: Secondary | ICD-10-CM

## 2016-10-13 NOTE — Progress Notes (Signed)
  Chief Complaint  Patient presents with  . Sore Throat    Onset: 10/09/16, woke up with scratchy sore throat, fatigued and just wanting to sleep, woke up this morning unable to talk, throat dry and scratchy, chest hurts, ha's (pressure), sweaty and cold, no temps noticed    HPI   Upper Respiratory Infection: Patient complains of symptoms of a URI. Symptoms include congestion. Onset of symptoms was 5 days ago, gradually worsening since that time.  She has been having a dry scratchy throat with some chest congestion, sweaty and cold but no fevers She is drinking plenty of fluids. Evaluation to date: none. Treatment to date: none.     Past Medical History:  Diagnosis Date  . Asthma   . Melanoma [facility])     Current Outpatient Prescriptions  Medication Sig Dispense Refill  . Calcium Carb-Cholecalciferol (CALCIUM-VITAMIN D) 600-400 MG-UNIT TABS Take by mouth daily.    . Lactobacillus (PROBIOTIC ACIDOPHILUS) CAPS Take 1 capsule by mouth daily.    . predniSONE (DELTASONE) 10 MG tablet Take 30 mg by mouth daily with breakfast.    . azithromycin (ZITHROMAX) 250 MG tablet Take 2 tablets today, one tablet each day after (Patient not taking: Reported on 10/13/2016) 6 tablet 0  . fluticasone (FLONASE) 50 MCG/ACT nasal spray Place 2 sprays into both nostrils daily. (Patient not taking: Reported on 10/13/2016) 16 g 6  . norethindrone (ORTHO MICRONOR) 0.35 MG tablet Take 1 tablet (0.35 mg total) by mouth daily. (Patient not taking: Reported on 05/18/2016) 1 Package 11   No current facility-administered medications for this visit.     Allergies: No Known Allergies  Past Surgical History:  Procedure Laterality Date  . lymph node removal    . melanoma removal  2008-2009    Social History   Social History  . Marital status: Single    Spouse name: N/A  . Number of children: N/A  . Years of education: N/A   Social History Main Topics  . Smoking status: Never Smoker  . Smokeless tobacco: Never  Used  . Alcohol use Yes     Comment: occ  . Drug use: No     Comment: in 20's used marijuana and cocaine  . Sexual activity: Not Asked   Other Topics Concern  . None   Social History Narrative  . None    ROS  Objective: Vitals:   10/13/16 1206  BP: 104/68  Pulse: 95  Resp: 17  Temp: 98.3 F (36.8 C)  TempSrc: Oral  SpO2: 96%  Weight: 178 lb 3.2 oz (80.8 kg)  Height: 5\' 6"  (1.676 m)    Physical Exam General: alert, oriented, in NAD Head: normocephalic, atraumatic, no sinus tenderness Eyes: EOM intact, no scleral icterus or conjunctival injection Ears: TM clear bilaterally Nose: mucosa nonerythematous, nonedematous Throat: no pharyngeal exudate or erythema Lymph: no posterior auricular, submental or cervical lymph adenopathy Heart: normal rate, normal sinus rhythm, no murmurs Lungs: clear to auscultation bilaterally, no wheezing   Assessment and Plan Jovani was seen today for sore throat.  Diagnoses and all orders for this visit:  Sore throat -     Cancel: POCT rapid strep A  Viral URI  Supportive care advised   [place]

## 2016-10-13 NOTE — Patient Instructions (Addendum)
1. Try Milta Deiters MD sinus rinse bottle 2. Stay home and rest 3. Take nyquil at night and theraflu in the day  Do not add any additional tylenol or benadryl as this in is the medications    IF you received an x-ray today, you will receive an invoice from [facility]. Please contact [facility] at [phone] with questions or concerns regarding your invoice.   IF you received labwork today, you will receive an invoice from [place]. Please contact [facility] at [phone] with questions or concerns regarding your invoice.   Our billing staff will not be able to assist you with questions regarding bills from these companies.  You will be contacted with the lab results as soon as they are available. The fastest way to get your results is to activate your My Chart account. Instructions are located on the last page of this paperwork. If you have not heard from [place] regarding the results in 2 weeks, please contact this office.     Sore Throat A sore throat is pain, burning, irritation, or scratchiness in the throat. When you have a sore throat, you may feel pain or tenderness in your throat when you swallow or talk. Many things can cause a sore throat, including:  An infection.  Seasonal allergies.  Dryness in the air.  Irritants, such as smoke or pollution.  Gastroesophageal reflux disease (GERD).  A tumor.  A sore throat is often the first sign of another sickness. It may happen with other symptoms, such as coughing, sneezing, fever, and swollen neck glands. Most sore throats go away without medical treatment. Follow these instructions at home:  Take over-the-counter medicines only as told by your health care provider.  Drink enough fluids to keep your urine clear or pale yellow.  Rest as needed.  To help with pain, try: ? Sipping warm liquids, such as broth, herbal tea, or warm water. ? Eating or drinking cold or frozen liquids, such as frozen ice  pops. ? Gargling with a salt-water mixture 3-4 times a day or as needed. To make a salt-water mixture, completely dissolve -1 tsp of salt in 1 cup of warm water. ? Sucking on hard candy or throat lozenges. ? Putting a cool-mist humidifier in your bedroom at night to moisten the air. ? Sitting in the bathroom with the door closed for 5-10 minutes while you run hot water in the shower.  Do not use any tobacco products, such as cigarettes, chewing tobacco, and e-cigarettes. If you need help quitting, ask your health care provider. Contact a health care provider if:  You have a fever for more than 2-3 days.  You have symptoms that last (are persistent) for more than 2-3 days.  Your throat does not get better within 7 days.  You have a fever and your symptoms suddenly get worse. Get help right away if:  You have difficulty breathing.  You cannot swallow fluids, soft foods, or your saliva.  You have increased swelling in your throat or neck.  You have persistent nausea and vomiting. This information is not intended to replace advice given to you by your health care provider. Make sure you discuss any questions you have with your health care provider. Document Released: 02/05/2004 Document Revised: 08/24/2015 Document Reviewed: 10/18/2014 Elsevier Interactive Patient Education  [employer].

## 2016-12-07 ENCOUNTER — Encounter: Payer: Self-pay | Admitting: Family Medicine

## 2016-12-07 NOTE — Progress Notes (Signed)
Chief Complaint  Patient presents with  . sti check  and pap    pt has a bump down there that she would like the doctor to look out  . URI    onset: last night, st last night but no more now more congestion and whole family has cold sxs    HPI  Vaginal Discharge Pt reports that she has vaginal discharge after intercourse She denies pain with intercourse She currently has some pelvic pain on the right side that felt like a period cramp Her last intercourse was 3 months ago She had a normal cycle this month Patient's last menstrual period was 11/28/2016. She reports that she would like std screening Pt reports that she has burning in her vaginal area for the past 72 hours  Acute URI Also noted nasal congestion No fevers or chills Nonproductive cough She denies recent asthma exacerbation No fevers or chills No tinnitus She feels like her symptoms are improving  Past Medical History:  Diagnosis Date  . Asthma   . Melanoma ([place])     Current Outpatient Medications  Medication Sig Dispense Refill  . predniSONE (DELTASONE) 10 MG tablet Take 30 mg by mouth daily with breakfast.    . azithromycin (ZITHROMAX) 250 MG tablet Take 2 tablets today, one tablet each day after (Patient not taking: Reported on 10/13/2016) 6 tablet 0  . Calcium Carb-Cholecalciferol (CALCIUM-VITAMIN D) 600-400 MG-UNIT TABS Take by mouth daily.    . fluticasone (FLONASE) 50 MCG/ACT nasal spray Place 2 sprays into both nostrils daily. (Patient not taking: Reported on 10/13/2016) 16 g 6  . Lactobacillus (PROBIOTIC ACIDOPHILUS) CAPS Take 1 capsule by mouth daily.    . norethindrone (ORTHO MICRONOR) 0.35 MG tablet Take 1 tablet (0.35 mg total) by mouth daily. (Patient not taking: Reported on 05/18/2016) 1 Package 11  . valACYclovir (VALTREX) 1000 MG tablet Take 1 tablet (1,000 mg total) by mouth 2 (two) times daily. 20 tablet 0   No current facility-administered medications for this visit.     Allergies: No Known  Allergies  Past Surgical History:  Procedure Laterality Date  . lymph node removal    . melanoma removal  2008-2009    Social History   Socioeconomic History  . Marital status: Single    Spouse name: None  . Number of children: None  . Years of education: None  . Highest education level: None  Social Needs  . Financial resource strain: None  . Food insecurity - worry: None  . Food insecurity - inability: None  . Transportation needs - medical: None  . Transportation needs - non-medical: None  Occupational History  . None  Tobacco Use  . Smoking status: Never Smoker  . Smokeless tobacco: Never Used  Substance and Sexual Activity  . Alcohol use: Yes    Comment: occ  . Drug use: No    Comment: in 20's used marijuana and cocaine  . Sexual activity: None  Other Topics Concern  . None  Social History Narrative  . None    Family History  Problem Relation Age of Onset  . Arthritis Mother   . Hypertension Mother   . Arthritis Father   . Hypertension Brother   . Arthritis Maternal Grandmother   . Stroke Maternal Grandmother   . Arthritis Maternal Grandfather   . Melanoma Maternal Grandfather   . Cancer Paternal Grandmother        breast  . Diabetes Paternal Grandmother      ROS Review  of Systems See HPI Constitution: No fevers or chills No malaise No diaphoresis Skin: No rash or itching Eyes: no blurry vision, no double vision GU: no dysuria or hematuria Neuro: no dizziness or headaches all others reviewed and negative   Objective: Vitals:   12/08/16 1113  BP: 128/76  Pulse: 92  Resp: 16  Temp: 98.7 F (37.1 C)  TempSrc: Oral  SpO2: 99%  Weight: 180 lb 6.4 oz (81.8 kg)  Height: 5\' 6"  (1.676 m)   General: alert, oriented, in NAD Head: normocephalic, atraumatic, no sinus tenderness Eyes: EOM intact, no scleral icterus or conjunctival injection Ears: TM clear bilaterally Nose: mucosa nonerythematous, nonedematous Throat: no pharyngeal exudate or  erythema Lymph: no posterior auricular, submental or cervical lymph adenopathy Heart: normal rate, normal sinus rhythm, no murmurs Lungs: clear to auscultation bilaterally, no wheezing    Physical Exam  Genitourinary:      Chaperone present Vaginal exam Labia normal bilaterally without skin lesions Urethral meatus normal appearing without erythema Vagina with white, watery discharge No CMT, ovaries small and not palpable Uterus midline, non-tender     Assessment and Plan Marrian was seen today for sti check  and pap and uri.  Diagnoses and all orders for this visit:  Vaginal discharge- will screen for std  Advised condom usage  Screen for STD (sexually transmitted disease)- consent given -     GC/Chlamydia Probe Amp -     RPR -     HIV antibody -     Hepatitis B surface antigen -     POCT Wet + KOH Prep  Genital lesion, female- will treat empirically  -     Herpes simplex virus culture  BV (bacterial vaginosis)- positive for BV. No Yeast  Other orders -     valACYclovir (VALTREX) 1000 MG tablet; Take 1 tablet (1,000 mg total) by mouth 2 (two) times daily.   A total of 25 minutes were spent face-to-face with the patient during this encounter and over half of that time was spent on counseling and coordination of care.    [place]

## 2016-12-08 ENCOUNTER — Other Ambulatory Visit: Payer: Self-pay

## 2016-12-08 ENCOUNTER — Ambulatory Visit: Payer: BLUE CROSS/BLUE SHIELD | Admitting: Family Medicine

## 2016-12-08 ENCOUNTER — Encounter: Payer: Self-pay | Admitting: Family Medicine

## 2016-12-08 VITALS — BP 128/76 | HR 92 | Temp 98.7°F | Resp 16 | Ht 66.0 in | Wt 180.4 lb

## 2016-12-08 DIAGNOSIS — Z113 Encounter for screening for infections with a predominantly sexual mode of transmission: Secondary | ICD-10-CM | POA: Diagnosis not present

## 2016-12-08 DIAGNOSIS — N949 Unspecified condition associated with female genital organs and menstrual cycle: Secondary | ICD-10-CM | POA: Diagnosis not present

## 2016-12-08 DIAGNOSIS — N760 Acute vaginitis: Secondary | ICD-10-CM

## 2016-12-08 DIAGNOSIS — N898 Other specified noninflammatory disorders of vagina: Secondary | ICD-10-CM | POA: Diagnosis not present

## 2016-12-08 DIAGNOSIS — B9689 Other specified bacterial agents as the cause of diseases classified elsewhere: Secondary | ICD-10-CM

## 2016-12-08 LAB — POCT WET + KOH PREP
Trich by wet prep: ABSENT
YEAST BY KOH: ABSENT
Yeast by wet prep: ABSENT

## 2016-12-08 MED ORDER — VALACYCLOVIR HCL 1 G PO TABS: 1000.0000 mg | ORAL_TABLET | Freq: Two times a day (BID) | ORAL | 0 refills | Status: DC

## 2016-12-08 NOTE — Patient Instructions (Addendum)
IF you received an x-ray today, you will receive an invoice from [facility]. Please contact [facility] at [phone] with questions or concerns regarding your invoice.   IF you received labwork today, you will receive an invoice from [facility]. Please contact [facility] at [phone] with questions or concerns regarding your invoice.   Our billing staff will not be able to assist you with questions regarding bills from these companies.  You will be contacted with the lab results as soon as they are available. The fastest way to get your results is to activate your My Chart account. Instructions are located on the last page of this paperwork. If you have not heard from [place] regarding the results in 2 weeks, please contact this office.     Bacterial Vaginosis Bacterial vaginosis is a vaginal infection that occurs when the normal balance of bacteria in the vagina is disrupted. It results from an overgrowth of certain bacteria. This is the most common vaginal infection among women ages 23-44. Because bacterial vaginosis increases your risk for STIs (sexually transmitted infections), getting treated can help reduce your risk for chlamydia, gonorrhea, herpes, and HIV (human immunodeficiency virus). Treatment is also important for preventing complications in pregnant women, because this condition can cause an early (premature) delivery. What are the causes? This condition is caused by an increase in harmful bacteria that are normally present in small amounts in the vagina. However, the reason that the condition develops is not fully understood. What increases the risk? The following factors may make you more likely to develop this condition:  Having a new sexual partner or multiple sexual partners.  Having unprotected sex.  Douching.  Having an intrauterine device (IUD).  Smoking.  Drug and alcohol abuse.  Taking certain antibiotic medicines.  Being  pregnant.  You cannot get bacterial vaginosis from toilet seats, bedding, swimming pools, or contact with objects around you. What are the signs or symptoms? Symptoms of this condition include:  Grey or white vaginal discharge. The discharge can also be watery or foamy.  A fish-like odor with discharge, especially after sexual intercourse or during menstruation.  Itching in and around the vagina.  Burning or pain with urination.  Some women with bacterial vaginosis have no signs or symptoms. How is this diagnosed? This condition is diagnosed based on:  Your medical history.  A physical exam of the vagina.  Testing a sample of vaginal fluid under a microscope to look for a large amount of bad bacteria or abnormal cells. Your health care provider may use a cotton swab or a small wooden spatula to collect the sample.  How is this treated? This condition is treated with antibiotics. These may be given as a pill, a vaginal cream, or a medicine that is put into the vagina (suppository). If the condition comes back after treatment, a second round of antibiotics may be needed. Follow these instructions at home: Medicines  Take over-the-counter and prescription medicines only as told by your health care provider.  Take or use your antibiotic as told by your health care provider. Do not stop taking or using the antibiotic even if you start to feel better. General instructions  If you have a female sexual partner, tell her that you have a vaginal infection. She should see her health care provider and be treated if she has symptoms. If you have a female sexual partner, he does not need treatment.  During treatment: ? Avoid sexual activity until you finish treatment. ?  Do not douche. ? Avoid alcohol as directed by your health care provider. ? Avoid breastfeeding as directed by your health care provider.  Drink enough water and fluids to keep your urine clear or pale yellow.  Keep the  area around your vagina and rectum clean. ? Wash the area daily with warm water. ? Wipe yourself from front to back after using the toilet.  Keep all follow-up visits as told by your health care provider. This is important. How is this prevented?  Do not douche.  Wash the outside of your vagina with warm water only.  Use protection when having sex. This includes latex condoms and dental dams.  Limit how many sexual partners you have. To help prevent bacterial vaginosis, it is best to have sex with just one partner (monogamous).  Make sure you and your sexual partner are tested for STIs.  Wear cotton or cotton-lined underwear.  Avoid wearing tight pants and pantyhose, especially during summer.  Limit the amount of alcohol that you drink.  Do not use any products that contain nicotine or tobacco, such as cigarettes and e-cigarettes. If you need help quitting, ask your health care provider.  Do not use illegal drugs. Where to find more information:  Centers for Disease Control and Prevention: AppraiserFraud.fi  American Sexual Health Association (ASHA): www.ashastd.org  U.S. Department of Health and Financial controller, Office on Women's Health: DustingSprays.pl or SecuritiesCard.it Contact a health care provider if:  Your symptoms do not improve, even after treatment.  You have more discharge or pain when urinating.  You have a fever.  You have pain in your abdomen.  You have pain during sex.  You have vaginal bleeding between periods. Summary  Bacterial vaginosis is a vaginal infection that occurs when the normal balance of bacteria in the vagina is disrupted.  Because bacterial vaginosis increases your risk for STIs (sexually transmitted infections), getting treated can help reduce your risk for chlamydia, gonorrhea, herpes, and HIV (human immunodeficiency virus). Treatment is also important for preventing complications in  pregnant women, because the condition can cause an early (premature) delivery.  This condition is treated with antibiotic medicines. These may be given as a pill, a vaginal cream, or a medicine that is put into the vagina (suppository). This information is not intended to replace advice given to you by your health care provider. Make sure you discuss any questions you have with your health care provider. Document Released: 12/28/2004 Document Revised: 09/13/2015 Document Reviewed: 09/13/2015 Elsevier Interactive Patient Education  2017 [employer].

## 2016-12-09 ENCOUNTER — Encounter: Payer: Self-pay | Admitting: Family Medicine

## 2016-12-09 LAB — GC/CHLAMYDIA PROBE AMP
CHLAMYDIA, DNA PROBE: NEGATIVE
Neisseria gonorrhoeae by PCR: NEGATIVE

## 2016-12-09 LAB — HIV ANTIBODY (ROUTINE TESTING W REFLEX): HIV SCREEN 4TH GENERATION: NONREACTIVE

## 2016-12-09 LAB — HEPATITIS B SURFACE ANTIGEN: HEP B S AG: NEGATIVE

## 2016-12-09 LAB — RPR: RPR Ser Ql: NONREACTIVE

## 2016-12-09 MED ORDER — FLUCONAZOLE 150 MG PO TABS: 150.0000 mg | ORAL_TABLET | Freq: Once | ORAL | 0 refills | Status: AC

## 2016-12-09 MED ORDER — METRONIDAZOLE 500 MG PO TABS: 500.0000 mg | ORAL_TABLET | Freq: Two times a day (BID) | ORAL | 0 refills | Status: DC

## 2016-12-12 LAB — HERPES SIMPLEX VIRUS CULTURE

## 2016-12-16 ENCOUNTER — Encounter: Payer: Self-pay | Admitting: Family Medicine

## 2016-12-16 DIAGNOSIS — B009 Herpesviral infection, unspecified: Secondary | ICD-10-CM | POA: Insufficient documentation

## 2017-01-01 ENCOUNTER — Encounter: Payer: Self-pay | Admitting: Family Medicine

## 2017-01-13 ENCOUNTER — Other Ambulatory Visit: Payer: Self-pay | Admitting: Family Medicine

## 2017-01-13 MED ORDER — VALACYCLOVIR HCL 1 G PO TABS: 1000.0000 mg | ORAL_TABLET | Freq: Every day | ORAL | 11 refills | Status: DC

## 2017-01-13 NOTE — Progress Notes (Signed)
Refilled valtrex  

## 2017-01-24 ENCOUNTER — Encounter: Payer: BLUE CROSS/BLUE SHIELD | Admitting: Internal Medicine

## 2017-01-27 DIAGNOSIS — Z79899 Other long term (current) drug therapy: Secondary | ICD-10-CM | POA: Diagnosis not present

## 2017-01-27 DIAGNOSIS — M3320 Polymyositis, organ involvement unspecified: Secondary | ICD-10-CM | POA: Diagnosis not present

## 2017-03-09 DIAGNOSIS — Z79899 Other long term (current) drug therapy: Secondary | ICD-10-CM | POA: Diagnosis not present

## 2017-03-09 DIAGNOSIS — D2270 Melanocytic nevi of unspecified lower limb, including hip: Secondary | ICD-10-CM | POA: Diagnosis not present

## 2017-03-09 DIAGNOSIS — D1801 Hemangioma of skin and subcutaneous tissue: Secondary | ICD-10-CM | POA: Diagnosis not present

## 2017-03-09 DIAGNOSIS — Z85820 Personal history of malignant melanoma of skin: Secondary | ICD-10-CM | POA: Diagnosis not present

## 2017-04-06 DIAGNOSIS — M3320 Polymyositis, organ involvement unspecified: Secondary | ICD-10-CM | POA: Diagnosis not present

## 2017-04-06 DIAGNOSIS — M3390 Dermatopolymyositis, unspecified, organ involvement unspecified: Secondary | ICD-10-CM | POA: Diagnosis not present

## 2017-04-21 DIAGNOSIS — M3390 Dermatopolymyositis, unspecified, organ involvement unspecified: Secondary | ICD-10-CM | POA: Diagnosis not present

## 2017-04-21 DIAGNOSIS — M3320 Polymyositis, organ involvement unspecified: Secondary | ICD-10-CM | POA: Diagnosis not present

## 2017-04-21 DIAGNOSIS — Z79899 Other long term (current) drug therapy: Secondary | ICD-10-CM | POA: Diagnosis not present

## 2017-06-09 DIAGNOSIS — F419 Anxiety disorder, unspecified: Secondary | ICD-10-CM | POA: Diagnosis not present

## 2017-06-21 DIAGNOSIS — F419 Anxiety disorder, unspecified: Secondary | ICD-10-CM | POA: Diagnosis not present

## 2017-06-30 DIAGNOSIS — F419 Anxiety disorder, unspecified: Secondary | ICD-10-CM | POA: Diagnosis not present

## 2017-07-12 DIAGNOSIS — F419 Anxiety disorder, unspecified: Secondary | ICD-10-CM | POA: Diagnosis not present

## 2017-07-18 DIAGNOSIS — F419 Anxiety disorder, unspecified: Secondary | ICD-10-CM | POA: Diagnosis not present

## 2017-07-21 DIAGNOSIS — Z79899 Other long term (current) drug therapy: Secondary | ICD-10-CM | POA: Diagnosis not present

## 2017-07-21 DIAGNOSIS — M3320 Polymyositis, organ involvement unspecified: Secondary | ICD-10-CM | POA: Diagnosis not present

## 2017-07-21 DIAGNOSIS — R531 Weakness: Secondary | ICD-10-CM | POA: Diagnosis not present

## 2017-07-21 DIAGNOSIS — Z5181 Encounter for therapeutic drug level monitoring: Secondary | ICD-10-CM | POA: Diagnosis not present

## 2017-07-21 DIAGNOSIS — Z85820 Personal history of malignant melanoma of skin: Secondary | ICD-10-CM | POA: Diagnosis not present

## 2017-08-04 DIAGNOSIS — F419 Anxiety disorder, unspecified: Secondary | ICD-10-CM | POA: Diagnosis not present

## 2017-08-09 DIAGNOSIS — F419 Anxiety disorder, unspecified: Secondary | ICD-10-CM | POA: Diagnosis not present

## 2017-08-15 ENCOUNTER — Other Ambulatory Visit: Payer: Self-pay | Admitting: Family Medicine

## 2017-08-15 DIAGNOSIS — B9689 Other specified bacterial agents as the cause of diseases classified elsewhere: Secondary | ICD-10-CM

## 2017-08-15 DIAGNOSIS — N898 Other specified noninflammatory disorders of vagina: Secondary | ICD-10-CM

## 2017-08-15 DIAGNOSIS — N760 Acute vaginitis: Secondary | ICD-10-CM

## 2017-08-15 DIAGNOSIS — F419 Anxiety disorder, unspecified: Secondary | ICD-10-CM | POA: Diagnosis not present

## 2017-08-30 DIAGNOSIS — F419 Anxiety disorder, unspecified: Secondary | ICD-10-CM | POA: Diagnosis not present

## 2017-09-15 DIAGNOSIS — F419 Anxiety disorder, unspecified: Secondary | ICD-10-CM | POA: Diagnosis not present

## 2017-09-27 DIAGNOSIS — F419 Anxiety disorder, unspecified: Secondary | ICD-10-CM | POA: Diagnosis not present

## 2017-10-05 ENCOUNTER — Encounter: Payer: Self-pay | Admitting: Emergency Medicine

## 2017-10-05 ENCOUNTER — Ambulatory Visit (INDEPENDENT_AMBULATORY_CARE_PROVIDER_SITE_OTHER): Payer: BLUE CROSS/BLUE SHIELD | Admitting: Emergency Medicine

## 2017-10-05 VITALS — BP 146/88 | HR 86 | Temp 97.8°F | Resp 17 | Ht 66.0 in | Wt 193.0 lb

## 2017-10-05 DIAGNOSIS — H6692 Otitis media, unspecified, left ear: Secondary | ICD-10-CM | POA: Diagnosis not present

## 2017-10-05 DIAGNOSIS — J069 Acute upper respiratory infection, unspecified: Secondary | ICD-10-CM

## 2017-10-05 MED ORDER — AMOXICILLIN-POT CLAVULANATE 875-125 MG PO TABS: 1.0000 | ORAL_TABLET | Freq: Two times a day (BID) | ORAL | 0 refills | Status: AC

## 2017-10-05 NOTE — Patient Instructions (Addendum)
     If you have lab work done today you will be contacted with your lab results within the next 2 weeks.  If you have not heard from us then please contact us. The fastest way to get your results is to register for My Chart.     IF you received an x-ray today, you will receive an invoice from [facility]. Please contact [facility] at [phone] with questions or concerns regarding your invoice.   IF you received labwork today, you will receive an invoice from [facility]. Please contact [facility] at [phone] with questions or concerns regarding your invoice.   Our billing staff will not be able to assist you with questions regarding bills from these companies.  You will be contacted with the lab results as soon as they are available. The fastest way to get your results is to activate your My Chart account. Instructions are located on the last page of this paperwork. If you have not heard from us regarding the results in 2 weeks, please contact this office.       Otitis Media, Adult Otitis media is redness, soreness, and puffiness (swelling) in the space just behind your eardrum (middle ear). It may be caused by allergies or infection. It often happens along with a cold. Follow these instructions at home:  Take your medicine as told. Finish it even if you start to feel better.  Only take over-the-counter or prescription medicines for pain, discomfort, or fever as told by your doctor.  Follow up with your doctor as told. Contact a doctor if:  You have otitis media only in one ear, or bleeding from your nose, or both.  You notice a lump on your neck.  You are not getting better in 3-5 days.  You feel worse instead of better. Get help right away if:  You have pain that is not helped with medicine.  You have puffiness, redness, or pain around your ear.  You get a stiff neck.  You cannot move part of your face (paralysis).  You notice that the bone  behind your ear hurts when you touch it. This information is not intended to replace advice given to you by your health care provider. Make sure you discuss any questions you have with your health care provider. Document Released: 06/16/2007 Document Revised: 06/05/2015 Document Reviewed: 07/25/2012 Elsevier Interactive Patient Education  2017 Elsevier Inc.        

## 2017-10-05 NOTE — Progress Notes (Signed)
[facility] 32 y.o.   Chief Complaint  Patient presents with  . patient says her ear drum burst,  . Generalized Body Aches  . Dizziness  . Nasal Congestion    HISTORY OF PRESENT ILLNESS: This is a 32 y.o. female complaining of nasal congestion with cough, dizziness, generalized achiness and left ear congestion.  Eardrum may have burst last night when she noticed bloody discharge.  Recently flew back from [place] 2 days ago.  HPI   Prior to Admission medications   Medication Sig Start Date End Date Taking? Authorizing Provider  Lactobacillus (PROBIOTIC ACIDOPHILUS) CAPS Take 1 capsule by mouth daily. 01/22/16  Yes Burns, Claudina Lick, MD  predniSONE (DELTASONE) 10 MG tablet Take 30 mg by mouth daily with breakfast.   Yes [provider]  valACYclovir (VALTREX) 1000 MG tablet Take 1 tablet (1,000 mg total) by mouth daily. 01/13/17  Yes Forrest Moron, MD  Calcium Carb-Cholecalciferol (CALCIUM-VITAMIN D) 600-400 MG-UNIT TABS Take by mouth daily.    [provider]  fluticasone (FLONASE) 50 MCG/ACT nasal spray Place 2 sprays into both nostrils daily. Patient not taking: Reported on 10/13/2016 05/18/16   Forrest Moron, MD  norethindrone (ORTHO MICRONOR) 0.35 MG tablet Take 1 tablet (0.35 mg total) by mouth daily. Patient not taking: Reported on 05/18/2016 02/25/16   Forrest Moron, MD    No Known Allergies  Patient Active Problem List   Diagnosis Date Noted  . 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 Long-term use of immunosuppressant medication 03/05/2016  . Multiple benign nevi of upper and lower extremities, and trunk 03/05/2016  . 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 Candidiasis 01/22/2016  . History of melanoma 09/12/2015  . Lymphedema of upper extremity following lymphadenectomy 07/31/2015  . Leg weakness, bilateral 07/31/2015  . Metastatic melanoma ([place]) 12/22/2010  . 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 Asthma   . Melanoma [facility])     Past Surgical History:  Procedure Laterality Date  . lymph node removal    . melanoma removal  2008-2009    Social History   Socioeconomic History  . Marital status: Single    Spouse name: Not on file  . Number of children: Not on file  . Years of education: Not on file  . Highest education level: Not on file  Occupational History  . Not on file  Social Needs  . Financial resource strain: Not on file  . Food insecurity:    Worry: Not on file    Inability: Not on file  . Transportation needs:    Medical: Not on file    Non-medical: Not on file  Tobacco Use  . Smoking status: Never Smoker  . Smokeless tobacco: Never Used  Substance and Sexual Activity  . Alcohol use: Yes    Comment: occ  . Drug use: No    Comment: in 20's used marijuana and cocaine  . Sexual activity: Not on file  Lifestyle  . Physical activity:    Days per week: Not on file    Minutes per session: Not on file  . Stress: Not on file  Relationships  . Social connections:    Talks on phone: Not on file    Gets together: Not on file    Attends religious service: Not on file    Active member of club or organization: Not on file    Attends meetings of clubs or organizations:  Not on file    Relationship status: Not on file  . Intimate partner violence:    Fear of current or ex partner: Not on file    Emotionally abused: Not on file    Physically abused: Not on file    Forced sexual activity: Not on file  Other Topics Concern  . Not on file  Social History Narrative  . Not on file    Family History  Problem Relation Age of Onset  . Arthritis Mother   . Hypertension Mother   . Arthritis Father   . Hypertension Brother   . Arthritis Maternal Grandmother   . Stroke Maternal Grandmother   . Arthritis Maternal Grandfather   . Melanoma Maternal Grandfather   . Cancer Paternal Grandmother        breast  . Diabetes Paternal Grandmother       Review of Systems  Constitutional: Positive for malaise/fatigue. Negative for chills and fever.  HENT: Positive for congestion, ear discharge and ear pain. Negative for nosebleeds and sore throat.   Eyes: Negative.  Negative for blurred vision and double vision.  Respiratory: Positive for cough.   Gastrointestinal: Negative.  Negative for abdominal pain, diarrhea, nausea and vomiting.  Musculoskeletal: Positive for myalgias.  Skin: Negative.  Negative for rash.  Neurological: Positive for dizziness.  Endo/Heme/Allergies: Negative.   All other systems reviewed and are negative.  Vitals:   10/05/17 1632  BP: (!) 146/88  Pulse: 86  Resp: 17  Temp: 97.8 F (36.6 C)  SpO2: 98%     Physical Exam  Constitutional: She is oriented to person, place, and time. She appears well-developed and well-nourished.  HENT:  Head: Normocephalic and atraumatic.  Right Ear: Tympanic membrane, external ear and ear canal normal.  Left Ear: Ear canal normal. Tympanic membrane is injected.  Mouth/Throat: Oropharynx is clear and moist.  Eyes: Pupils are equal, round, and reactive to light. Conjunctivae and EOM are normal.  Neck: Normal range of motion. Neck supple.  Cardiovascular: Normal rate, regular rhythm and normal heart sounds.  Pulmonary/Chest: Effort normal and breath sounds normal.  Musculoskeletal: Normal range of motion.  Neurological: She is alert and oriented to person, place, and time. No sensory deficit. She exhibits normal muscle tone.  Skin: Skin is warm and dry. Capillary refill takes less than 2 seconds.  Psychiatric: She has a normal mood and affect. Her behavior is normal.  Vitals reviewed.    ASSESSMENT & PLAN: Patricia Blevins was seen today for patient says her ear drum burst,, generalized body aches, dizziness and nasal congestion.  Diagnoses and all orders for this visit:  Left otitis media, unspecified otitis media type Comments: Possible TM perforation Orders: -      amoxicillin-clavulanate (AUGMENTIN) 875-125 MG tablet; Take 1 tablet by mouth 2 (two) times daily for 7 days.  Acute upper respiratory infection    Patient Instructions       If you have lab work done today you will be contacted with your lab results within the next 2 weeks.  If you have not heard from [place] then please contact us. The fastest way to get your results is to register for My Chart.   IF you received an x-ray today, you will receive an invoice from [facility]. Please contact [facility] at [phone] with questions or concerns regarding your invoice.   IF you received labwork today, you will receive an invoice from [facility]. Please contact [facility] at [phone] with questions or concerns regarding your invoice.  Our billing staff will not be able to assist you with questions regarding bills from these companies.  You will be contacted with the lab results as soon as they are available. The fastest way to get your results is to activate your My Chart account. Instructions are located on the last page of this paperwork. If you have not heard from [place] regarding the results in 2 weeks, please contact this office.      Otitis Media, Adult Otitis media is redness, soreness, and puffiness (swelling) in the space just behind your eardrum (middle ear). It may be caused by allergies or infection. It often happens along with a cold. Follow these instructions at home:  Take your medicine as told. Finish it even if you start to feel better.  Only take over-the-counter or prescription medicines for pain, discomfort, or fever as told by your doctor.  Follow up with your doctor as told. Contact a doctor if:  You have otitis media only in one ear, or bleeding from your nose, or both.  You notice a lump on your neck.  You are not getting better in 3-5 days.  You feel worse instead of better. Get help right away if:  You have pain that is not helped with  medicine.  You have puffiness, redness, or pain around your ear.  You get a stiff neck.  You cannot move part of your face (paralysis).  You notice that the bone behind your ear hurts when you touch it. This information is not intended to replace advice given to you by your health care provider. Make sure you discuss any questions you have with your health care provider. Document Released: 06/16/2007 Document Revised: 06/05/2015 Document Reviewed: 07/25/2012 Elsevier Interactive Patient Education  2017 Elsevier Inc.      Agustina Caroli, MD [facility]

## 2017-10-08 ENCOUNTER — Telehealth: Payer: Self-pay | Admitting: Family Medicine

## 2017-10-08 ENCOUNTER — Telehealth: Payer: Self-pay | Admitting: Emergency Medicine

## 2017-10-08 NOTE — Telephone Encounter (Signed)
Error please ignore

## 2017-10-08 NOTE — Telephone Encounter (Signed)
Pt. Called to request change in medication prescribed on visit with Dr. Mitchel Honour on 10/05/17. Pt. Reports having "uncontrollable Diarrhea"   Please Advise

## 2017-10-18 ENCOUNTER — Ambulatory Visit: Payer: BLUE CROSS/BLUE SHIELD | Admitting: Physician Assistant

## 2017-10-18 ENCOUNTER — Other Ambulatory Visit: Payer: Self-pay

## 2017-10-18 ENCOUNTER — Encounter: Payer: Self-pay | Admitting: Physician Assistant

## 2017-10-18 VITALS — BP 121/87 | HR 75 | Temp 98.7°F | Resp 16 | Ht 67.75 in | Wt 190.2 lb

## 2017-10-18 DIAGNOSIS — T3695XA Adverse effect of unspecified systemic antibiotic, initial encounter: Secondary | ICD-10-CM

## 2017-10-18 DIAGNOSIS — M3320 Polymyositis, organ involvement unspecified: Secondary | ICD-10-CM | POA: Diagnosis not present

## 2017-10-18 DIAGNOSIS — H6690 Otitis media, unspecified, unspecified ear: Secondary | ICD-10-CM

## 2017-10-18 DIAGNOSIS — B379 Candidiasis, unspecified: Secondary | ICD-10-CM

## 2017-10-18 MED ORDER — CEFDINIR 300 MG PO CAPS: 300.0000 mg | ORAL_CAPSULE | Freq: Two times a day (BID) | ORAL | 0 refills | Status: AC

## 2017-10-18 MED ORDER — FLUCONAZOLE 150 MG PO TABS: 150.0000 mg | ORAL_TABLET | Freq: Once | ORAL | 0 refills | Status: AC

## 2017-10-18 NOTE — Patient Instructions (Addendum)
  Please take antibiotic as prescribed. Avoid getting water in left ear. Use flonase and zyrtec for allergies. Follow up with ENT as planned.  If you develop a yeast infection, take diflucan. Follow up here if symptoms worsen or you develop new concerning symptoms.  Thank you for letting me participate in your health and well being.    If you have lab work done today you will be contacted with your lab results within the next 2 weeks.  If you have not heard from [place] then please contact us. The fastest way to get your results is to register for My Chart.  Otitis Media, Adult Otitis media is redness, soreness, and puffiness (swelling) in the space just behind your eardrum (middle ear). It may be caused by allergies or infection. It often happens along with a cold. Follow these instructions at home:  Take your medicine as told. Finish it even if you start to feel better.  Only take over-the-counter or prescription medicines for pain, discomfort, or fever as told by your doctor.  Follow up with your doctor as told. Contact a doctor if:  You have otitis media only in one ear, or bleeding from your nose, or both.  You notice a lump on your neck.  You are not getting better in 3-5 days.  You feel worse instead of better. Get help right away if:  You have pain that is not helped with medicine.  You have puffiness, redness, or pain around your ear.  You get a stiff neck.  You cannot move part of your face (paralysis).  You notice that the bone behind your ear hurts when you touch it. This information is not intended to replace advice given to you by your health care provider. Make sure you discuss any questions you have with your health care provider. Document Released: 06/16/2007 Document Revised: 06/05/2015 Document Reviewed: 07/25/2012 Elsevier Interactive Patient Education  2017 [employer].   IF you received an x-ray today, you will receive an invoice from [facility]iology. Please contact [facility] at [phone] with questions or concerns regarding your invoice.   IF you received labwork today, you will receive an invoice from [facility]. Please contact [facility] at [phone] with questions or concerns regarding your invoice.   Our billing staff will not be able to assist you with questions regarding bills from these companies.  You will be contacted with the lab results as soon as they are available. The fastest way to get your results is to activate your My Chart account. Instructions are located on the last page of this paperwork. If you have not heard from [place] regarding the results in 2 weeks, please contact this office.

## 2017-10-18 NOTE — Progress Notes (Signed)
MRN: [id number] DOB: 07/08/1985  Subjective:   Patricia Blevins is a 32 y.o. female presenting for chief complaint of ears ("feel pressurized". Right ear since Monday. Left ear felt alot better over the weekend, on Monday of this week, all the pressure came back.) .  Reports 2 day history of right ear pain/pressure.  Has associated muffled hearing sensation, nasal congestion, and sinus pressure.  Denies right ear discharge, itching, tinnitus, dizziness.Of note, patient experienced left TM perforation a few weeks ago.  Was seen in office.  Had underlying otitis media of left ear.  Treated with Augmentin.  Her symptoms resolved.  However, has felt some ear discomfort in the left ear over the past few days. Denies fever, sinus pain, sore throat, shortness of breath, chest tightness, chest pain and myalgia, nausea, vomiting, abdominal pain and diarrhea.  Has tried Tylenol and Advil with no full relief. Denies Q-tip use. Has history of seasonal allergies, but does not take any daily.  No history of asthma.  Denies smoking.  Has PMH of polymyositis.  Denies any other aggravating or relieving factors, no other questions or concerns.  ROS per HPI  Patricia Blevins has a current medication list which includes the following prescription(s): calcium-vitamin d, probiotic acidophilus, mycophenolate, prednisone, riTUXimab in sodium chloride 0.9 % 250 mL, valacyclovir, fluticasone, and norethindrone. Also has No Known Allergies.  Patricia Blevins  has a past medical history of Asthma and Melanoma ([place]). Also  has a past surgical history that includes melanoma removal (2008-2009) and lymph node removal.   Objective:   Vitals: BP 121/87 (BP Location: Right Arm, Patient Position: Sitting, Cuff Size: Large)   Pulse 75   Temp 98.7 F (37.1 C) (Oral)   Resp 16   Ht 5' 7.75" (1.721 m)   Wt 190 lb 3.2 oz (86.3 kg)   LMP 09/19/2017   SpO2 99%   BMI 29.13 kg/m   Physical Exam  Constitutional: She is oriented to person, place,  and time. She appears well-developed and well-nourished.  HENT:  Head: Normocephalic and atraumatic.  Right Ear: No drainage or tenderness. Tympanic membrane is erythematous and bulging.  Left Ear: No drainage or tenderness. Tympanic membrane is erythematous and bulging.  Ears:  Nose: Mucosal edema and rhinorrhea present. Right sinus exhibits no maxillary sinus tenderness and no frontal sinus tenderness. Left sinus exhibits no maxillary sinus tenderness and no frontal sinus tenderness.  Mouth/Throat: Uvula is midline, oropharynx is clear and moist and mucous membranes are normal.  B/L TMs with white, bulging, and erythematous.   Eyes: Right eye exhibits discharge (watery). Left eye exhibits discharge (watery). Right conjunctiva is injected ( mild). Left eye conjunctiva injected:  mild.  Neck: Normal range of motion.  Pulmonary/Chest: Effort normal.  Neurological: She is alert and oriented to person, place, and time.  Skin: Skin is warm and dry.  Psychiatric: She has a normal mood and affect.  Vitals reviewed.   No results found for this or any previous visit (from the past 24 hour(s)).  Assessment and Plan :  1. Acute otitis media, unspecified otitis media type Pt with new AOM of right ear and recurrent AOM of left ear. Will treat with omnicef at this time. Referral to ENT placed. Given strict return precautions.  - cefdinir (OMNICEF) 300 MG capsule; Take 1 capsule (300 mg total) by mouth 2 (two) times daily for 10 days.  Dispense: 20 capsule; Refill: 0 - Ambulatory referral to ENT  2. Polymyositis ([place]) 3. Antibiotic-induced yeast infection -  fluconazole (DIFLUCAN) 150 MG tablet; Take 1 tablet (150 mg total) by mouth once for 1 dose. Repeat if needed  Dispense: 2 tablet; Refill: 0  Side effects, risks, benefits, and alternatives of the medications and treatment plan prescribed today were discussed, and patient expressed understanding of the instructions given. No barriers to  understanding were identified. Red flags discussed in detail. Pt expressed understanding regarding what to do in case of emergency/urgent symptoms.  Tenna Delaine, PA-C  [facility] at [facility] 10/18/2017 4:31 PM

## 2017-10-19 ENCOUNTER — Ambulatory Visit: Payer: BLUE CROSS/BLUE SHIELD | Admitting: Family Medicine

## 2017-10-20 ENCOUNTER — Encounter: Payer: Self-pay | Admitting: Physician Assistant

## 2017-10-21 DIAGNOSIS — H66003 Acute suppurative otitis media without spontaneous rupture of ear drum, bilateral: Secondary | ICD-10-CM | POA: Diagnosis not present

## 2017-10-25 DIAGNOSIS — F419 Anxiety disorder, unspecified: Secondary | ICD-10-CM | POA: Diagnosis not present

## 2017-11-08 DIAGNOSIS — F419 Anxiety disorder, unspecified: Secondary | ICD-10-CM | POA: Diagnosis not present

## 2017-11-21 DIAGNOSIS — M3320 Polymyositis, organ involvement unspecified: Secondary | ICD-10-CM | POA: Diagnosis not present

## 2017-11-21 DIAGNOSIS — Z85820 Personal history of malignant melanoma of skin: Secondary | ICD-10-CM | POA: Diagnosis not present

## 2017-11-21 DIAGNOSIS — Z79899 Other long term (current) drug therapy: Secondary | ICD-10-CM | POA: Diagnosis not present

## 2017-12-01 DIAGNOSIS — F419 Anxiety disorder, unspecified: Secondary | ICD-10-CM | POA: Diagnosis not present

## 2017-12-20 DIAGNOSIS — F419 Anxiety disorder, unspecified: Secondary | ICD-10-CM | POA: Diagnosis not present

## 2018-01-02 DIAGNOSIS — F419 Anxiety disorder, unspecified: Secondary | ICD-10-CM | POA: Diagnosis not present

## 2018-01-24 DIAGNOSIS — F419 Anxiety disorder, unspecified: Secondary | ICD-10-CM | POA: Diagnosis not present

## 2018-02-01 DIAGNOSIS — M3320 Polymyositis, organ involvement unspecified: Secondary | ICD-10-CM | POA: Diagnosis not present

## 2018-02-01 DIAGNOSIS — Z79899 Other long term (current) drug therapy: Secondary | ICD-10-CM | POA: Diagnosis not present

## 2018-02-02 DIAGNOSIS — F419 Anxiety disorder, unspecified: Secondary | ICD-10-CM | POA: Diagnosis not present

## 2018-02-07 ENCOUNTER — Other Ambulatory Visit: Payer: Self-pay | Admitting: Family Medicine

## 2018-02-09 DIAGNOSIS — F419 Anxiety disorder, unspecified: Secondary | ICD-10-CM | POA: Diagnosis not present

## 2018-02-16 DIAGNOSIS — F419 Anxiety disorder, unspecified: Secondary | ICD-10-CM | POA: Diagnosis not present

## 2018-02-23 DIAGNOSIS — F419 Anxiety disorder, unspecified: Secondary | ICD-10-CM | POA: Diagnosis not present

## 2018-03-01 DIAGNOSIS — F419 Anxiety disorder, unspecified: Secondary | ICD-10-CM | POA: Diagnosis not present

## 2018-03-16 DIAGNOSIS — F419 Anxiety disorder, unspecified: Secondary | ICD-10-CM | POA: Diagnosis not present

## 2018-03-28 DIAGNOSIS — F419 Anxiety disorder, unspecified: Secondary | ICD-10-CM | POA: Diagnosis not present

## 2018-04-06 DIAGNOSIS — F419 Anxiety disorder, unspecified: Secondary | ICD-10-CM | POA: Diagnosis not present

## 2018-04-13 ENCOUNTER — Telehealth: Payer: BLUE CROSS/BLUE SHIELD | Admitting: Physician Assistant

## 2018-04-13 DIAGNOSIS — N760 Acute vaginitis: Secondary | ICD-10-CM | POA: Diagnosis not present

## 2018-04-13 MED ORDER — FLUCONAZOLE 150 MG PO TABS: 150.0000 mg | ORAL_TABLET | Freq: Once | ORAL | 0 refills | Status: AC

## 2018-04-13 NOTE — Progress Notes (Signed)
I have spent 5 minutes in review of e-visit questionnaire, review and updating patient chart, medical decision making and response to patient.   Damarys Speir Cody Jeily Guthridge, PA-C    

## 2018-04-13 NOTE — Progress Notes (Signed)

## 2018-04-17 DIAGNOSIS — F419 Anxiety disorder, unspecified: Secondary | ICD-10-CM | POA: Diagnosis not present

## 2018-04-27 DIAGNOSIS — F419 Anxiety disorder, unspecified: Secondary | ICD-10-CM | POA: Diagnosis not present

## 2018-05-04 DIAGNOSIS — F419 Anxiety disorder, unspecified: Secondary | ICD-10-CM | POA: Diagnosis not present

## 2018-05-06 ENCOUNTER — Telehealth: Payer: BLUE CROSS/BLUE SHIELD | Admitting: Nurse Practitioner

## 2018-05-06 DIAGNOSIS — B3731 Acute candidiasis of vulva and vagina: Secondary | ICD-10-CM

## 2018-05-06 DIAGNOSIS — B373 Candidiasis of vulva and vagina: Secondary | ICD-10-CM | POA: Diagnosis not present

## 2018-05-06 MED ORDER — FLUCONAZOLE 150 MG PO TABS: 150.0000 mg | ORAL_TABLET | Freq: Once | ORAL | 0 refills | Status: AC

## 2018-05-06 NOTE — Progress Notes (Signed)

## 2018-05-11 DIAGNOSIS — F419 Anxiety disorder, unspecified: Secondary | ICD-10-CM | POA: Diagnosis not present

## 2018-05-18 DIAGNOSIS — F419 Anxiety disorder, unspecified: Secondary | ICD-10-CM | POA: Diagnosis not present

## 2018-05-22 ENCOUNTER — Other Ambulatory Visit: Payer: Self-pay | Admitting: Family Medicine

## 2018-05-26 DIAGNOSIS — F419 Anxiety disorder, unspecified: Secondary | ICD-10-CM | POA: Diagnosis not present

## 2018-06-01 DIAGNOSIS — F419 Anxiety disorder, unspecified: Secondary | ICD-10-CM | POA: Diagnosis not present

## 2018-06-09 DIAGNOSIS — M3320 Polymyositis, organ involvement unspecified: Secondary | ICD-10-CM | POA: Diagnosis not present

## 2018-06-12 DIAGNOSIS — Z79899 Other long term (current) drug therapy: Secondary | ICD-10-CM | POA: Diagnosis not present

## 2018-06-12 DIAGNOSIS — M3320 Polymyositis, organ involvement unspecified: Secondary | ICD-10-CM | POA: Diagnosis not present

## 2018-06-16 DIAGNOSIS — F419 Anxiety disorder, unspecified: Secondary | ICD-10-CM | POA: Diagnosis not present

## 2018-06-22 DIAGNOSIS — F419 Anxiety disorder, unspecified: Secondary | ICD-10-CM | POA: Diagnosis not present

## 2018-06-28 DIAGNOSIS — F419 Anxiety disorder, unspecified: Secondary | ICD-10-CM | POA: Diagnosis not present

## 2018-07-05 DIAGNOSIS — F419 Anxiety disorder, unspecified: Secondary | ICD-10-CM | POA: Diagnosis not present

## 2018-07-13 DIAGNOSIS — F419 Anxiety disorder, unspecified: Secondary | ICD-10-CM | POA: Diagnosis not present

## 2018-07-25 DIAGNOSIS — F419 Anxiety disorder, unspecified: Secondary | ICD-10-CM | POA: Diagnosis not present

## 2018-08-03 DIAGNOSIS — F419 Anxiety disorder, unspecified: Secondary | ICD-10-CM | POA: Diagnosis not present

## 2018-08-11 DIAGNOSIS — F419 Anxiety disorder, unspecified: Secondary | ICD-10-CM | POA: Diagnosis not present

## 2018-08-15 ENCOUNTER — Other Ambulatory Visit: Payer: Self-pay | Admitting: Family Medicine

## 2018-08-16 DIAGNOSIS — F419 Anxiety disorder, unspecified: Secondary | ICD-10-CM | POA: Diagnosis not present

## 2018-08-29 ENCOUNTER — Other Ambulatory Visit: Payer: Self-pay | Admitting: Family Medicine

## 2018-08-29 DIAGNOSIS — F419 Anxiety disorder, unspecified: Secondary | ICD-10-CM | POA: Diagnosis not present

## 2018-09-06 DIAGNOSIS — M3320 Polymyositis, organ involvement unspecified: Secondary | ICD-10-CM | POA: Diagnosis not present

## 2018-09-06 DIAGNOSIS — Z5181 Encounter for therapeutic drug level monitoring: Secondary | ICD-10-CM | POA: Diagnosis not present

## 2018-09-07 ENCOUNTER — Encounter: Payer: Self-pay | Admitting: Family Medicine

## 2018-09-11 DIAGNOSIS — F419 Anxiety disorder, unspecified: Secondary | ICD-10-CM | POA: Diagnosis not present

## 2018-09-20 ENCOUNTER — Ambulatory Visit (INDEPENDENT_AMBULATORY_CARE_PROVIDER_SITE_OTHER)
Admission: RE | Admit: 2018-09-20 | Discharge: 2018-09-20 | Disposition: A | Discharge status: Home / Self Care | Payer: BC Managed Care – PPO | Source: Ambulatory Visit

## 2018-09-20 DIAGNOSIS — B3731 Acute candidiasis of vulva and vagina: Secondary | ICD-10-CM

## 2018-09-20 DIAGNOSIS — B373 Candidiasis of vulva and vagina: Secondary | ICD-10-CM

## 2018-09-20 MED ORDER — FLUCONAZOLE 150 MG PO TABS: 150.0000 mg | ORAL_TABLET | Freq: Every day | ORAL | 0 refills | Status: DC

## 2018-09-20 MED ORDER — NYSTATIN 100000 UNIT/GM EX POWD: Freq: Three times a day (TID) | CUTANEOUS | 0 refills | Status: DC

## 2018-09-20 NOTE — Discharge Instructions (Signed)
Start diflucan as directed. Can continue use of monistat to the external rash. If needed, can fill nystatin powder to help with external rash. If noticing spreading redness, warmth, pain, swelling, fever, follow up for in person evaluation.

## 2018-09-20 NOTE — ED Provider Notes (Signed)
Virtual Visit via Video Note:  Patricia Blevins  initiated request for Telemedicine visit with [facility] team. I connected with Patricia Blevins  on 09/20/2018 at 9:46 AM  for a synchronized telemedicine visit using a video enabled HIPPA compliant telemedicine application. I verified that I am speaking with Patricia Blevins  using two identifiers. Ok Edwards, PA-C  was physically located in a [facility]gent care site and Patricia Blevins was located at a different location.   The limitations of evaluation and management by telemedicine as well as the availability of in-person appointments were discussed. Patient was informed that she  may incur a bill ( including co-pay) for this virtual visit encounter. [employer]  expressed understanding and gave verbal consent to proceed with virtual visit.     History of Present Illness:Patricia Blevins  is a 33 y.o. female presents with 1 week history of vaginal itching. States she has recurrent yeast infections, usually due to taking too many baths. States she has been taking more walks, and the increase sweating could also have caused symptoms. She also has some itching to the labia majora, and states has had history of yeast to the inner thighs as well. Denies vaginal discharge. Denies abdominal pain, nausea, vomiting. Denies urinary symptoms such as frequency, dysuria, hematuria. Denies fever, chills, night sweats. She is not sexually active. LMP 08/29/2018. She has been trying monistat with temporary relief.   Past Medical History:  Diagnosis Date  . Asthma   . Melanoma ([place])     No Known Allergies      Observations/Objective: General: Well appearing, nontoxic, no acute distress. Sitting comfortably. Head: Normocephalic, atraumatic Eye: No conjunctival injection, eyelid swelling. EOMI ENT: Mucus membranes moist, no lip cracking. No obvious nasal drainage. Pulm: Speaking in full sentences without difficulty. Normal effort. No respiratory  distress, accessory muscle use. Abd: Soft, nontender per patient Neuro: Normal mental status. Alert and oriented x 3.   Assessment and Plan: Will provide diflucan as directed. Patient can use left over antifungal cream for external use. If rash spreading down inner thigh, can try nystatin. Return precautions given. Patient expresses understanding and agrees to plan.  Follow Up Instructions:    I discussed the assessment and treatment plan with the patient. The patient was provided an opportunity to ask questions and all were answered. The patient agreed with the plan and demonstrated an understanding of the instructions.   The patient was advised to call back or seek an in-person evaluation if the symptoms worsen or if the condition fails to improve as anticipated.  I provided 15 minutes of non-face-to-face time during this encounter.    Ok Edwards, PA-C  09/20/2018 9:46 AM         Ok Edwards, PA-C 09/20/18 [phone]

## 2018-09-21 DIAGNOSIS — F419 Anxiety disorder, unspecified: Secondary | ICD-10-CM | POA: Diagnosis not present

## 2018-09-21 DIAGNOSIS — M3320 Polymyositis, organ involvement unspecified: Secondary | ICD-10-CM | POA: Diagnosis not present

## 2018-09-21 DIAGNOSIS — Z5181 Encounter for therapeutic drug level monitoring: Secondary | ICD-10-CM | POA: Diagnosis not present

## 2018-10-09 DIAGNOSIS — F419 Anxiety disorder, unspecified: Secondary | ICD-10-CM | POA: Diagnosis not present

## 2018-10-12 IMAGING — DX DG CHEST 2V
2 series · 2 of 2 positions shown · non-contrast
Comparison: None.

CLINICAL DATA: Productive cough and wheezing.

EXAM:
CHEST  2 VIEW

[chest pa]
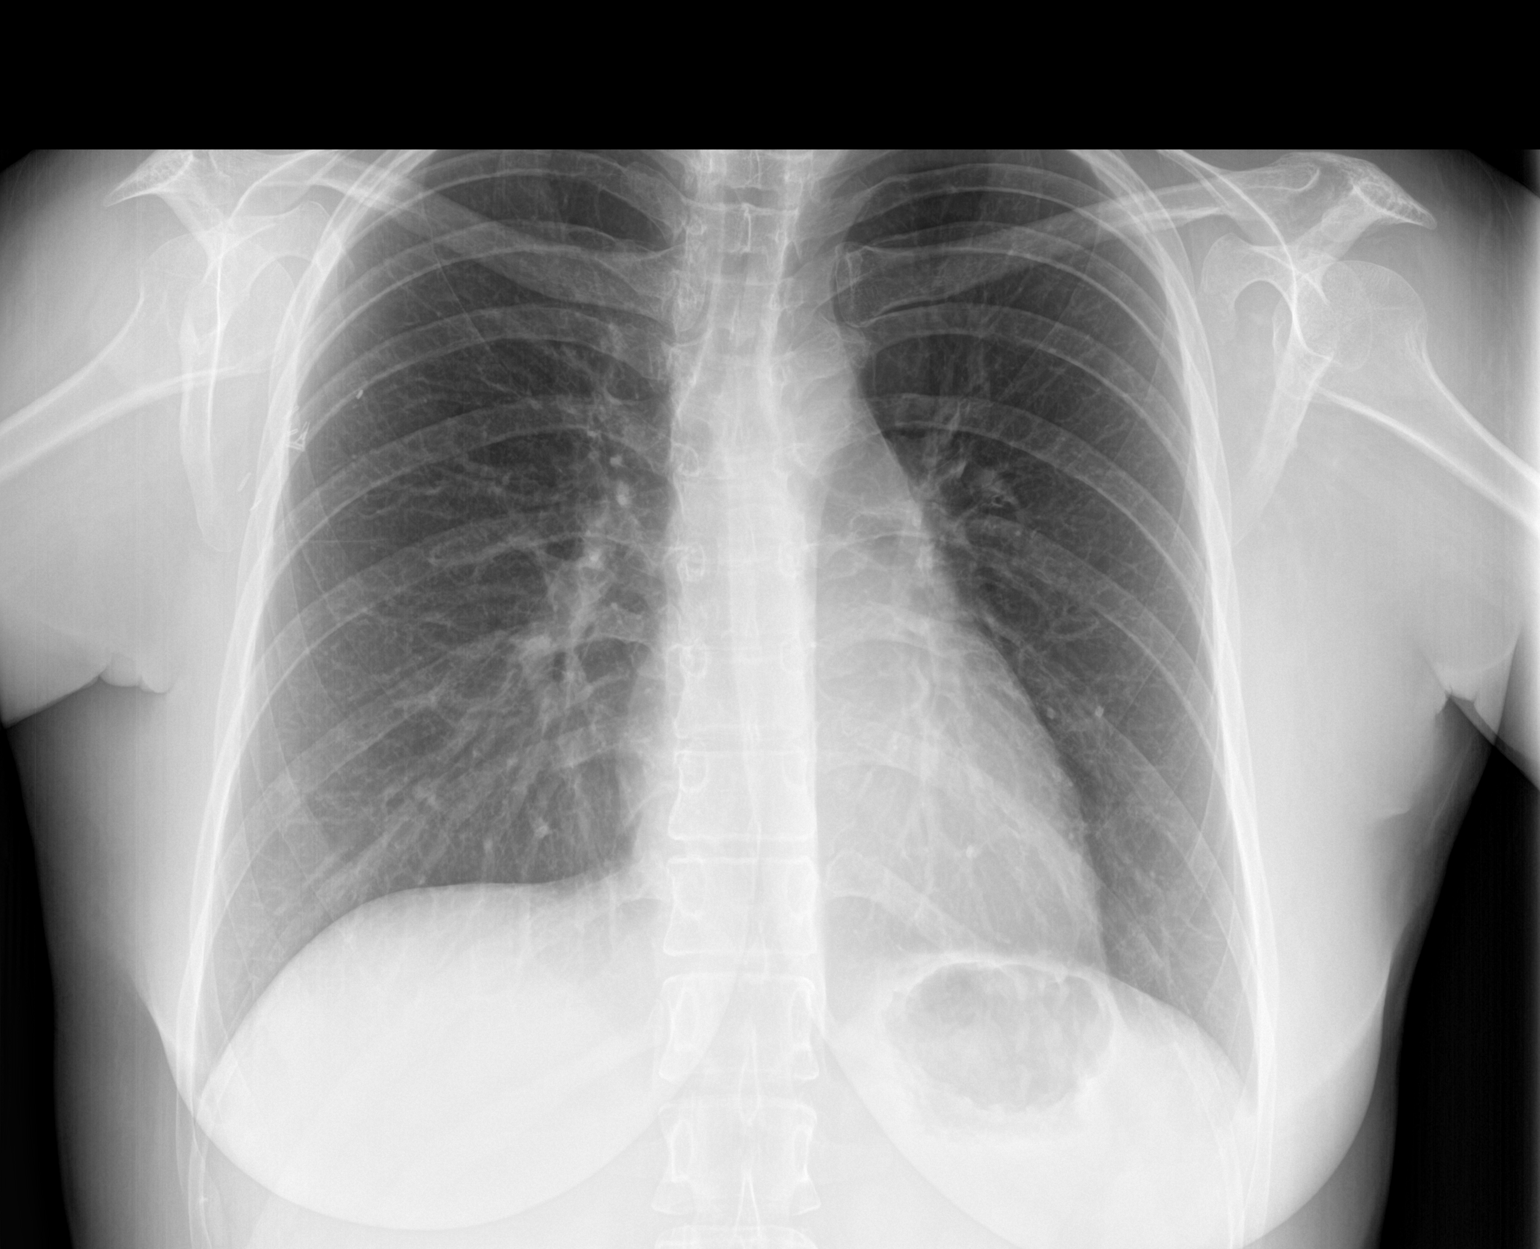

[chest lat]
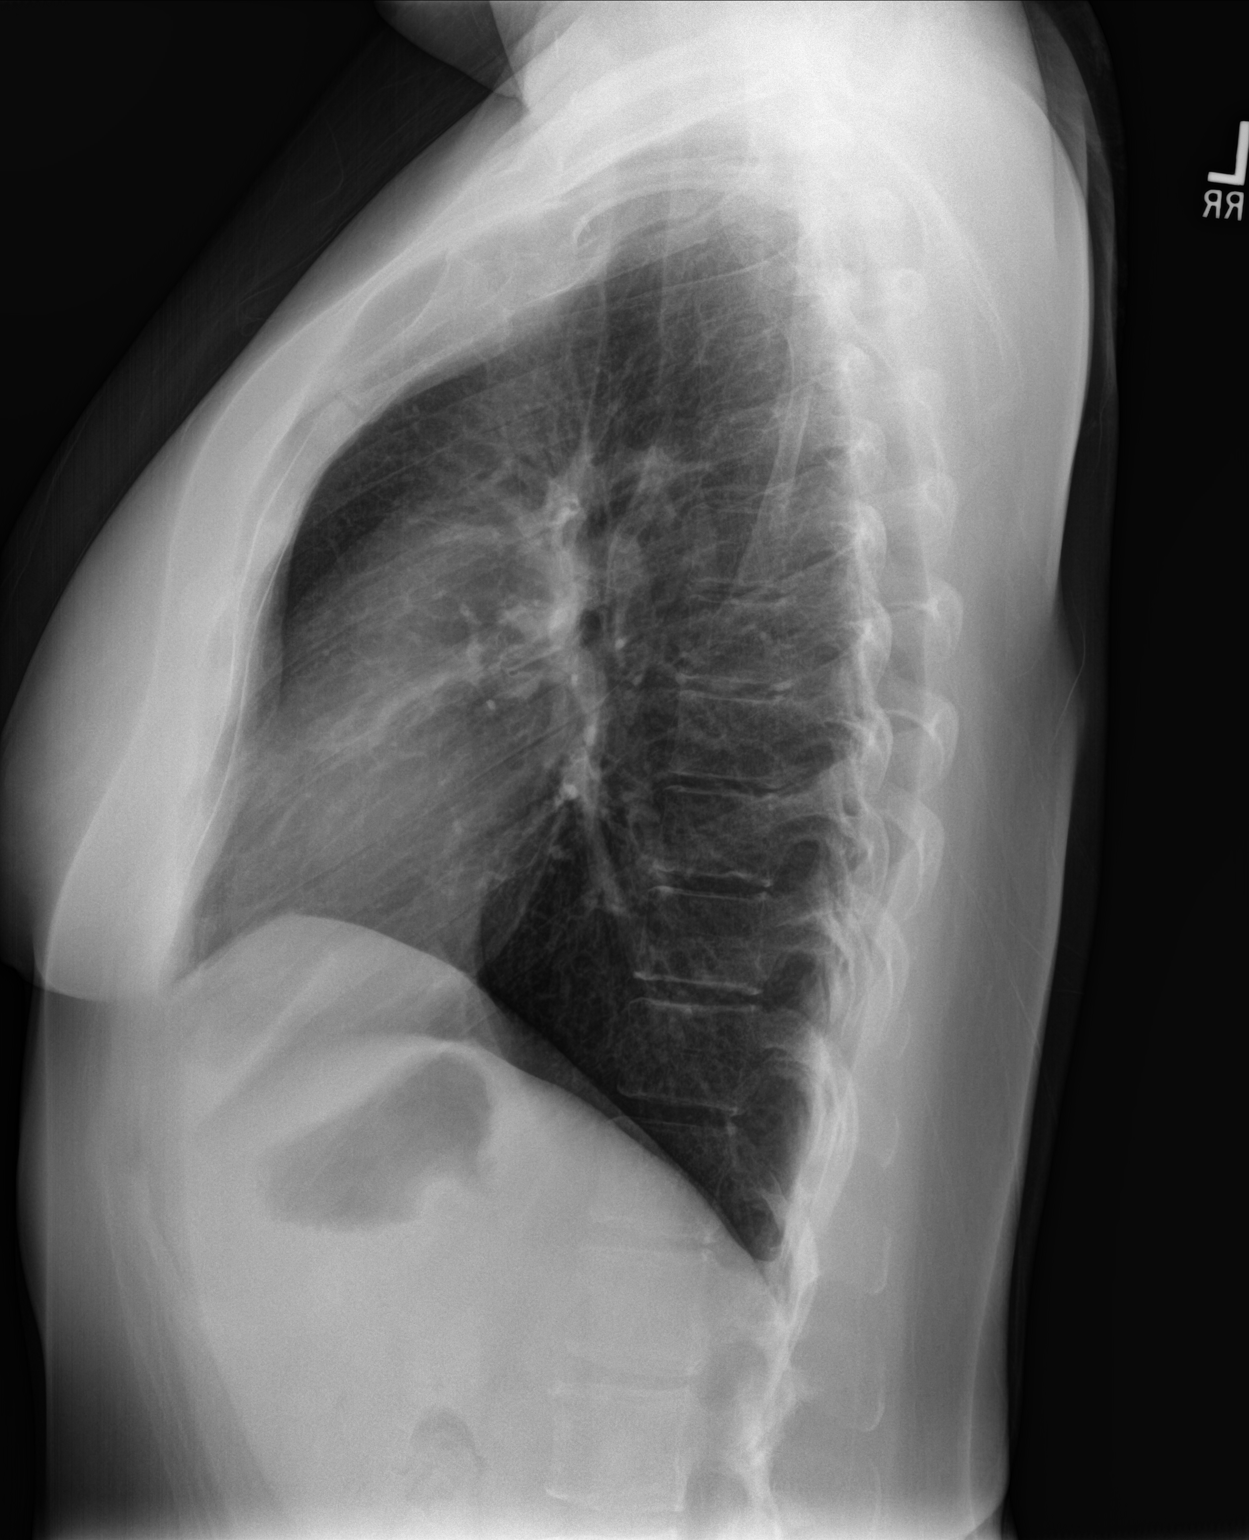

[2 of 2 positions shown; findings below may reference images not displayed]

FINDINGS: Both lungs are clear. Surgical clips in the right axillary region.
Heart and mediastinum are within normal limits. The trachea is
midline. No pleural effusions.
IMPRESSION: No active cardiopulmonary disease.

## 2018-10-18 DIAGNOSIS — F419 Anxiety disorder, unspecified: Secondary | ICD-10-CM | POA: Diagnosis not present

## 2018-10-26 DIAGNOSIS — F419 Anxiety disorder, unspecified: Secondary | ICD-10-CM | POA: Diagnosis not present

## 2018-11-09 DIAGNOSIS — F419 Anxiety disorder, unspecified: Secondary | ICD-10-CM | POA: Diagnosis not present

## 2018-11-10 DIAGNOSIS — Z5181 Encounter for therapeutic drug level monitoring: Secondary | ICD-10-CM | POA: Diagnosis not present

## 2018-11-10 DIAGNOSIS — M3320 Polymyositis, organ involvement unspecified: Secondary | ICD-10-CM | POA: Diagnosis not present

## 2018-11-27 DIAGNOSIS — M3320 Polymyositis, organ involvement unspecified: Secondary | ICD-10-CM | POA: Diagnosis not present

## 2018-11-27 DIAGNOSIS — M6281 Muscle weakness (generalized): Secondary | ICD-10-CM | POA: Diagnosis not present

## 2018-11-27 DIAGNOSIS — R262 Difficulty in walking, not elsewhere classified: Secondary | ICD-10-CM | POA: Diagnosis not present

## 2018-12-05 DIAGNOSIS — F419 Anxiety disorder, unspecified: Secondary | ICD-10-CM | POA: Diagnosis not present

## 2018-12-14 DIAGNOSIS — F419 Anxiety disorder, unspecified: Secondary | ICD-10-CM | POA: Diagnosis not present

## 2018-12-20 DIAGNOSIS — Z85820 Personal history of malignant melanoma of skin: Secondary | ICD-10-CM | POA: Diagnosis not present

## 2018-12-20 DIAGNOSIS — M3320 Polymyositis, organ involvement unspecified: Secondary | ICD-10-CM | POA: Diagnosis not present

## 2018-12-20 DIAGNOSIS — Z23 Encounter for immunization: Secondary | ICD-10-CM | POA: Diagnosis not present

## 2018-12-20 DIAGNOSIS — Z5181 Encounter for therapeutic drug level monitoring: Secondary | ICD-10-CM | POA: Diagnosis not present

## 2018-12-26 ENCOUNTER — Encounter: Payer: Self-pay | Admitting: Family Medicine

## 2018-12-28 DIAGNOSIS — F419 Anxiety disorder, unspecified: Secondary | ICD-10-CM | POA: Diagnosis not present

## 2019-01-02 ENCOUNTER — Telehealth: Payer: Self-pay | Admitting: *Deleted

## 2019-01-02 ENCOUNTER — Other Ambulatory Visit: Payer: Self-pay | Admitting: Family Medicine

## 2019-01-02 NOTE — Telephone Encounter (Signed)
Requested medication (s) are due for refill today: yes  Requested medication (s) are on the active medication list: yes  Last refill:  08/29/2018  Future visit scheduled: yes  Notes to clinic:  no valid encounter within last 12 months   Requested Prescriptions  Pending Prescriptions Disp Refills   valACYclovir (VALTREX) 1000 MG tablet [Pharmacy Med Name: VALACYCLOVIR HCL 1 GRAM TABLET] 90 tablet 0    Sig: TAKE 1 TABLET BY MOUTH EVERY DAY      Antimicrobials:  Antiviral Agents - Anti-Herpetic Failed - 01/02/2019  1:51 AM      Failed - Valid encounter within last 12 months    Recent Outpatient Visits           1 year ago Acute otitis media, unspecified otitis media type   Primary Care at [facility], [place] D, PA-C   1 year ago Left otitis media, unspecified otitis media type   Primary Care at [facility], Ines Bloomer, MD   2 years ago Vaginal discharge   Primary Care at [facility], [place], MD   2 years ago Sore throat   Primary Care at [place], MD   2 years ago Myalgia   Primary Care at [place], MD       Future Appointments             In 1 week Forrest Moron, MD Primary Care at [place], [facility]

## 2019-01-02 NOTE — Telephone Encounter (Signed)
Patient will need an appointment for refills  No refills sent

## 2019-01-09 ENCOUNTER — Ambulatory Visit: Payer: BC Managed Care – PPO | Admitting: Adult Health Nurse Practitioner

## 2019-01-09 ENCOUNTER — Encounter: Payer: Self-pay | Admitting: Adult Health Nurse Practitioner

## 2019-01-09 ENCOUNTER — Telehealth: Payer: Medicaid Other | Admitting: Family Medicine

## 2019-01-09 ENCOUNTER — Other Ambulatory Visit: Payer: Self-pay

## 2019-01-09 VITALS — BP 138/100 | HR 68 | Temp 97.6°F | Ht 67.0 in | Wt 206.6 lb

## 2019-01-09 DIAGNOSIS — E559 Vitamin D deficiency, unspecified: Secondary | ICD-10-CM | POA: Diagnosis not present

## 2019-01-09 DIAGNOSIS — R030 Elevated blood-pressure reading, without diagnosis of hypertension: Secondary | ICD-10-CM | POA: Insufficient documentation

## 2019-01-09 DIAGNOSIS — R69 Illness, unspecified: Secondary | ICD-10-CM

## 2019-01-09 MED ORDER — VALACYCLOVIR HCL 1 G PO TABS: 1000.0000 mg | ORAL_TABLET | Freq: Every day | ORAL | 2 refills | Status: DC

## 2019-01-09 NOTE — Addendum Note (Signed)
Addended by: Kittie Plater, Kingdom Vanzanten HUA on: 01/09/2019 10:40 AM   Modules accepted: Orders

## 2019-01-09 NOTE — Patient Instructions (Signed)
° ° ° °  If you have lab work done today you will be contacted with your lab results within the next 2 weeks.  If you have not heard from us then please contact us. The fastest way to get your results is to register for My Chart. ° ° °IF you received an x-ray today, you will receive an invoice from [facility]. Please contact [facility] at [phone] with questions or concerns regarding your invoice.  ° °IF you received labwork today, you will receive an invoice from [facility]. Please contact [facility] at [phone] with questions or concerns regarding your invoice.  ° °Our billing staff will not be able to assist you with questions regarding bills from these companies. ° °You will be contacted with the lab results as soon as they are available. The fastest way to get your results is to activate your My Chart account. Instructions are located on the last page of this paperwork. If you have not heard from us regarding the results in 2 weeks, please contact this office. °  ° ° ° °

## 2019-01-09 NOTE — Progress Notes (Signed)
Chief Complaint  Patient presents with  . Hypertension    Pt stated that when she went to her neuro her BP was running high so they told her to F/U with her PCP  . score 8    HPI   Patient presents for follow-up on blood pressure.  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.  Recently seen in November 2020.  Blood pressure was elevated at the visit and she was asked to follow-up with primary care.  She has been taking it at home and running [id number] over systolic on average to 0000000 to [id number] diastolic.  Today on the visit blood pressures were consecutively number one 170/108 number two 150/100 number three 138/100.  Patient denies chills fevers night sweats.  No loss of taste or smell.  No chest pain or chest pressure.  She would like to work on her blood pressures independently before starting medication.  She noted that she has been eating more and in taking more salt due to the prednisone.  She is trying to watch her diet and exercise that she is able to do with the dermatomyositis.   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  Medications    Current Outpatient Medications:  .  Calcium Carb-Cholecalciferol (CALCIUM-VITAMIN D) 600-400 MG-UNIT TABS, Take by mouth  daily., Disp: , Rfl:  .  fluconazole (DIFLUCAN) 150 MG tablet, Take 1 tablet (150 mg total) by mouth daily. Take second dose 72 hours later if symptoms still persists., Disp: 2 tablet, Rfl: 0 .  Lactobacillus (PROBIOTIC ACIDOPHILUS) CAPS, Take 1 capsule by mouth daily., Disp: , Rfl:  .  mycophenolate (CELLCEPT) 250 MG capsule, Take by mouth 2 (two) times daily., Disp: , Rfl:  .  nystatin (MYCOSTATIN/NYSTOP) powder, Apply topically 3 (three) times daily., Disp: 30 g, Rfl: 0 .  riTUXimab in sodium chloride 0.9 % 250 mL, Inject into the vein once., Disp: , Rfl:  .  valACYclovir (VALTREX) 1000 MG tablet, Take 1 tablet (1,000 mg total) by mouth daily., Disp: 90 tablet, Rfl: 2 .  predniSONE (DELTASONE) 10 MG tablet, Take 30 mg by mouth daily with breakfast., Disp: , Rfl:    Review of Systems    Constitutional: Negative for activity change, appetite change, chills and fever.  HENT: Negative for congestion, nosebleeds, trouble swallowing and voice change.   Respiratory: Negative for cough, shortness of breath and wheezing.   Gastrointestinal: Negative for diarrhea, nausea and vomiting.  Genitourinary: Negative for difficulty urinating, dysuria, flank pain and hematuria.  Musculoskeletal: Negative for back pain, joint swelling and neck pain.  Neurological: Negative for dizziness, speech difficulty, light-headedness and numbness.  See HPI. All other review of systems negative.     Physical Exam:   Physical Examination: General appearance - alert, well appearing, and in no distress and oriented to person, place, and time Mental status - normal mood, behavior, speech, dress, motor activity, and thought  processes Eyes - not examined, Visually impaired -Peter's anomaly Neck - supple, no significant adenopathy, carotids upstroke normal bilaterally, no bruits, thyroid exam: thyroid is normal in size without nodules or tenderness Chest - clear to auscultation, no wheezes, rales or rhonchi, symmetric air  entry  Heart - normal rate, regular rhythm, normal S1, S2, no murmurs, rubs, clicks or gallops Extremities - dependent LE edema without clubbing or cyanosis Skin - normal coloration and turgor, no rashes, no suspicious skin lesions noted  No hyperpigmentation of skin.  No current hematomas noted   Lab Review   labs are reviewed, up to date and normal, with the exception of her Vitamin D. Last known level in 2017 was 28:  She is deficient   Assessment & Plan:  Patricia Blevins is a 33 y.o. female . 1. Vitamin D deficiency   2. Elevated BP without diagnosis of hypertension    Orders Placed This Encounter  Procedures  . VITAMIN D 25 Hydroxy (Vit-D Deficiency, Fractures)  . Thyroid Panel With TSH   Meds ordered this encounter  Medications  . valACYclovir (VALTREX) 1000 MG tablet    Sig: Take 1 tablet (1,000 mg total) by mouth daily.    Dispense:  90 tablet    Refill:  2    Will f/u regarding blood pressures in 2 months.   Glyn Ade, NP

## 2019-01-10 LAB — THYROID PANEL WITH TSH
Free Thyroxine Index: 1.9 (ref 1.2–4.9)
T3 Uptake Ratio: 26 % (ref 24–39)
T4, Total: 7.2 ug/dL (ref 4.5–12.0)
TSH: 2.59 u[IU]/mL (ref 0.450–4.500)

## 2019-01-10 LAB — VITAMIN D 25 HYDROXY (VIT D DEFICIENCY, FRACTURES): Vit D, 25-Hydroxy: 26.8 ng/mL — ABNORMAL LOW (ref 30.0–100.0)

## 2019-01-12 HISTORY — PX: SKIN BIOPSY: SHX1

## 2019-01-16 DIAGNOSIS — F419 Anxiety disorder, unspecified: Secondary | ICD-10-CM | POA: Diagnosis not present

## 2019-01-17 ENCOUNTER — Encounter: Payer: Self-pay | Admitting: Family Medicine

## 2019-01-23 ENCOUNTER — Encounter: Payer: Self-pay | Admitting: Family Medicine

## 2019-01-26 DIAGNOSIS — M3390 Dermatopolymyositis, unspecified, organ involvement unspecified: Secondary | ICD-10-CM | POA: Diagnosis not present

## 2019-02-01 DIAGNOSIS — F419 Anxiety disorder, unspecified: Secondary | ICD-10-CM | POA: Diagnosis not present

## 2019-02-08 DIAGNOSIS — F419 Anxiety disorder, unspecified: Secondary | ICD-10-CM | POA: Diagnosis not present

## 2019-02-13 DIAGNOSIS — M3320 Polymyositis, organ involvement unspecified: Secondary | ICD-10-CM | POA: Diagnosis not present

## 2019-02-13 DIAGNOSIS — M3390 Dermatopolymyositis, unspecified, organ involvement unspecified: Secondary | ICD-10-CM | POA: Diagnosis not present

## 2019-02-22 DIAGNOSIS — F419 Anxiety disorder, unspecified: Secondary | ICD-10-CM | POA: Diagnosis not present

## 2019-02-23 ENCOUNTER — Other Ambulatory Visit: Payer: Self-pay | Admitting: Adult Health Nurse Practitioner

## 2019-02-23 MED ORDER — VITAMIN D (ERGOCALCIFEROL) 1.25 MG (50000 UNIT) PO CAPS: 50000.0000 [IU] | ORAL_CAPSULE | ORAL | 3 refills | Status: AC

## 2019-03-07 ENCOUNTER — Ambulatory Visit: Payer: BC Managed Care – PPO | Admitting: Family Medicine

## 2019-03-07 DIAGNOSIS — F419 Anxiety disorder, unspecified: Secondary | ICD-10-CM | POA: Diagnosis not present

## 2019-03-22 DIAGNOSIS — F419 Anxiety disorder, unspecified: Secondary | ICD-10-CM | POA: Diagnosis not present

## 2019-04-16 DIAGNOSIS — N898 Other specified noninflammatory disorders of vagina: Secondary | ICD-10-CM | POA: Diagnosis not present

## 2019-04-16 DIAGNOSIS — Z01419 Encounter for gynecological examination (general) (routine) without abnormal findings: Secondary | ICD-10-CM | POA: Diagnosis not present

## 2019-04-16 DIAGNOSIS — Z124 Encounter for screening for malignant neoplasm of cervix: Secondary | ICD-10-CM | POA: Diagnosis not present

## 2019-04-30 DIAGNOSIS — E559 Vitamin D deficiency, unspecified: Secondary | ICD-10-CM | POA: Diagnosis not present

## 2019-04-30 DIAGNOSIS — Z0001 Encounter for general adult medical examination with abnormal findings: Secondary | ICD-10-CM | POA: Diagnosis not present

## 2019-04-30 DIAGNOSIS — B009 Herpesviral infection, unspecified: Secondary | ICD-10-CM | POA: Diagnosis not present

## 2019-04-30 DIAGNOSIS — I890 Lymphedema, not elsewhere classified: Secondary | ICD-10-CM | POA: Diagnosis not present

## 2019-05-18 DIAGNOSIS — D849 Immunodeficiency, unspecified: Secondary | ICD-10-CM | POA: Diagnosis not present

## 2019-05-18 DIAGNOSIS — D1801 Hemangioma of skin and subcutaneous tissue: Secondary | ICD-10-CM | POA: Diagnosis not present

## 2019-05-18 DIAGNOSIS — D2270 Melanocytic nevi of unspecified lower limb, including hip: Secondary | ICD-10-CM | POA: Diagnosis not present

## 2019-05-18 DIAGNOSIS — Z85820 Personal history of malignant melanoma of skin: Secondary | ICD-10-CM | POA: Diagnosis not present

## 2019-05-28 DIAGNOSIS — E7849 Other hyperlipidemia: Secondary | ICD-10-CM | POA: Diagnosis not present

## 2019-05-28 DIAGNOSIS — I10 Essential (primary) hypertension: Secondary | ICD-10-CM | POA: Diagnosis not present

## 2019-05-28 DIAGNOSIS — U071 COVID-19: Secondary | ICD-10-CM | POA: Diagnosis not present

## 2019-05-28 DIAGNOSIS — E559 Vitamin D deficiency, unspecified: Secondary | ICD-10-CM | POA: Diagnosis not present

## 2019-05-28 DIAGNOSIS — F4320 Adjustment disorder, unspecified: Secondary | ICD-10-CM | POA: Diagnosis not present

## 2019-06-04 DIAGNOSIS — M25552 Pain in left hip: Secondary | ICD-10-CM | POA: Diagnosis not present

## 2019-06-04 DIAGNOSIS — M6281 Muscle weakness (generalized): Secondary | ICD-10-CM | POA: Diagnosis not present

## 2019-06-04 DIAGNOSIS — M25551 Pain in right hip: Secondary | ICD-10-CM | POA: Diagnosis not present

## 2019-06-04 DIAGNOSIS — M25511 Pain in right shoulder: Secondary | ICD-10-CM | POA: Diagnosis not present

## 2019-09-11 ENCOUNTER — Other Ambulatory Visit: Payer: Self-pay

## 2019-09-11 ENCOUNTER — Encounter: Payer: Self-pay | Admitting: Physical Therapy

## 2019-09-11 ENCOUNTER — Ambulatory Visit: Payer: BC Managed Care – PPO | Attending: Family Medicine | Admitting: Physical Therapy

## 2019-09-11 DIAGNOSIS — I890 Lymphedema, not elsewhere classified: Secondary | ICD-10-CM

## 2019-09-11 DIAGNOSIS — R293 Abnormal posture: Secondary | ICD-10-CM

## 2019-09-11 DIAGNOSIS — M6281 Muscle weakness (generalized): Secondary | ICD-10-CM | POA: Insufficient documentation

## 2019-09-11 DIAGNOSIS — M79601 Pain in right arm: Secondary | ICD-10-CM

## 2019-09-11 NOTE — Therapy (Signed)
[place], [place], [address] Phone: [phone]   Fax:  [phone]  Physical Therapy Evaluation  Patient Details  Name: Patricia Blevins MRN: [id number] Date of Birth: [birth year] Referring Provider (PT): Horald Pollen   Encounter Date: 09/11/2019   PT End of Session - 09/11/19 1407    Visit Number 1    Number of Visits 11    Date for PT Re-Evaluation 10/16/19    PT Start Time 0909    PT Stop Time 0955    PT Time Calculation (min) 46 min    Activity Tolerance Patient tolerated treatment well    Behavior During Therapy [facility] for tasks assessed/performed           Past Medical History:  Diagnosis Date  . Asthma   . Melanoma [facility])     Past Surgical History:  Procedure Laterality Date  . lymph node removal    . melanoma removal  2008-2009    There were no vitals filed for this visit.    Subjective Assessment - 09/11/19 0918    Subjective I am having swelling in both legs. I had lymph nodes removed from my right axilla so my right arm is swelling and I can not get my sleeve on. I could not really use my arm with the sleeve on because I am very weak and it was too hard to move.    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 No/denies              [facility] PT Assessment - 09/11/19 0001      Assessment   Medical Diagnosis metastatic melanoma    Referring Provider (PT) Horald Pollen    Onset Date/Surgical Date 01/12/07    Hand Dominance Right    Prior Therapy 2018 lymphedema eval [facility]      Precautions   Precautions Other (comment)    Precaution Comments lymphedema      Restrictions   Weight Bearing Restrictions No      Balance Screen   Has the patient fallen in  the past 6 months No    Has the patient had a decrease in activity level because of a fear of falling?  Yes    Is the patient reluctant to leave their home because of a fear of falling?  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, prior to covid did standing and walking    Leisure just started exercising over the last 3 months- was 2x/wk water therapy and exercises at home      Cognition   Overall Cognitive Status Within Functional Limits for  tasks assessed      Posture/Postural Control   Posture/Postural Control Postural limitations    Postural Limitations Rounded Shoulders;Forward head      ROM / Strength   AROM / PROM / Strength AROM      AROM   AROM Assessment Site Shoulder    Right/Left Shoulder Right;Left    Right Shoulder Flexion 144 Degrees    Right Shoulder ABduction 131 Degrees    Right Shoulder Internal Rotation 54 Degrees    Right Shoulder External Rotation 49 Degrees    Left Shoulder Flexion 150 Degrees    Left Shoulder ABduction 132 Degrees    Left Shoulder Internal Rotation 43 Degrees    Left Shoulder External Rotation 70 Degrees             LYMPHEDEMA/ONCOLOGY QUESTIONNAIRE - 09/11/19 0001      Type   Cancer Type metastatic melanoma      Surgeries   Other Surgery Date 01/12/07    Number Lymph Nodes Removed --   all nodes removed, a few nodes removed from left side as wel     Treatment   Active Chemotherapy Treatment No    Past Chemotherapy Treatment No    Active  Radiation Treatment No    Past Radiation Treatment No      What other symptoms do you have   Are you Having Heaviness or Tightness Yes    Are you having Pain Yes    Are you having pitting edema Yes    Body Site right wrist    Is it Hard or Difficult finding clothes that fit Yes    Do you have infections Yes    Comments in 2012 or 2013      Lymphedema Assessments   Lymphedema Assessments Upper extremities      Right Upper Extremity Lymphedema   15 cm Proximal to Olecranon Process 33.4 cm    Olecranon Process 30 cm    15 cm Proximal to Ulnar Styloid Process 31 cm    Just Proximal to Ulnar Styloid Process 21 cm    Across Hand at [employer] 20.1 cm    At [place] of 2nd Digit 6.6 cm      Left Upper Extremity Lymphedema   15 cm Proximal to Olecranon Process 36.5 cm    Olecranon Process 28.5 cm    15 cm Proximal to Ulnar Styloid Process 29 cm    Just Proximal to Ulnar Styloid Process 17.1 cm    Across Hand at [employer] 18.9 cm    At [place] of 2nd Digit 6.5 cm                   Objective measurements completed on examination: See above findings.               PT Education - 09/11/19 1406    Education Details anatomy and physiology of lymphatic system, importance of compression during exercise, how muscle movements helps lymphedema, typical use of compression pump, LE swelling unrelated to lymphedema    Person(s) Educated Patient    Methods Explanation    Comprehension Verbalized understanding               PT Long Term Goals - 09/11/19 1403      PT LONG TERM GOAL #1   Title Pt will have an understanding of a good schedule for daily management of lymphedema incluidng compression garments and pump use to decrease risk of infection.  Time 5    Period Weeks    Status New    Target Date 10/16/19      PT LONG TERM GOAL #2   Title Pt will obtain appropriate compression garments for long term management of RUE lymphedema and bilateral LE swelling  that she is able to don/doff and still use her extremities.    Time 5    Period Weeks    Status New    Target Date 10/16/19      PT LONG TERM GOAL #3   Title Pt will report 50% decrease in pain and discomfort in RUE to allow improved comfort.    Time 5    Period Weeks    Status New    Target Date 10/16/19      PT LONG TERM GOAL #4   Title Pt will be independent in self MLD for R UE for long term management of lymphedema.    Time 5    Period Weeks    Status New    Target Date 10/16/19                  Plan - 09/11/19 0958    Clinical Impression Statement Pt presents to PT with R UE lymphedema and bilateral LE swelling when exercising. She had metastatic melanoma in 2009 and had nearly all lymph nodes removed from R axilla and several removed from L axilla. She also has polymyositis. Her swelling began after a plane flight. She has been to a lymphedema clinic before and has undergone bandaging but due to her polymyositis she was unable to move her arm due to weakness with the bandages in place. She was fitted with sleeves but is unable to don/doff them due to polymyositis and has trouble moving her arm when they are in place. She reports she feels bad after using her compression pump. Educated pt that this is not typical and to try using it at night before bed. Educated pt that normal use is daily but will monitor how pt feels after she tries this over the next week. Educated pt about purchasing a compression shirt from [employer] goods to see if this helps her arm swelling when exercising. Pt would benefit from skilled PT services to decrease RUE swelling, assist pt with obtaining compression garments that she is able to don/doff and move her arm in and educate pt on a good schedule for daily management of lymphedema. 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    Personal Factors and Comorbidities Comorbidity 2    Comorbidities  metastatic melanoma, polymyositis    Examination-Activity Limitations Locomotion Level;Stand    Examination-Participation Restrictions Laundry;Cleaning;Community Activity;Meal Prep;Occupation    Stability/Clinical Decision Making Stable/Uncomplicated    Clinical Decision Making Low    Rehab Potential Good    PT Frequency 2x / week    PT Duration 4 weeks    PT Treatment/Interventions ADLs/Self Care Home Management;DME Instruction;Therapeutic exercise;Therapeutic activities;Patient/family education;Manual lymph drainage;Manual techniques;Compression bandaging;Passive range of motion;Taping;Vasopneumatic Device    PT Next Visit Plan discuss different compression sleeve options that pt can manage and move in (polymyositis makes it hard for her to move in thick/tight garments)-maybe even lymphadiva?, did she obtain compression shirt, did compression socks work for her, how did pump make her feel?, begin MLD to RUE and instruct pt in self MLD    PT Home Exercise Plan try using pump nightly, obtain compression shirt from [employer]  and wear when exercising, try compression socks and see if they help    Consulted and Agree with Plan of Care Patient           Patient will benefit from skilled therapeutic intervention in order to improve the following deficits and impairments:  Pain, Postural dysfunction, Impaired UE functional use, Decreased strength, Decreased knowledge of precautions, Increased edema  Visit Diagnosis: Lymphedema, not elsewhere classified  Pain in right arm  Muscle weakness (generalized)  Abnormal posture     Problem List Patient Active Problem List   Diagnosis Date Noted  . Vitamin D deficiency 01/09/2019  . Elevated BP without diagnosis of hypertension 01/09/2019  . Taking multiple medications for chronic disease 01/09/2019  . Acute upper respiratory infection 10/05/2017  . Left otitis media 10/05/2017  . 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 Long-term  use of immunosuppressant medication 03/05/2016  . Multiple benign nevi of upper and lower extremities, and trunk 03/05/2016  . 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 Candidiasis 01/22/2016  . History of melanoma 09/12/2015  . Lymphedema of upper extremity following lymphadenectomy 07/31/2015  . Leg weakness, bilateral 07/31/2015  . Metastatic melanoma ([place]) 12/22/2010  . PM (polymyositis) ([place]) 12/22/2010    Allyson Sabal [facility] 09/11/2019, 2:09 PM  [place], [place], [address] Phone: [phone]   Fax:  [phone]  Name: Micheal Sheen MRN: [id number] Date of Birth: Jul 31, 1985  Manus Gunning, PT 09/11/19 2:09 PM

## 2019-09-25 ENCOUNTER — Other Ambulatory Visit: Payer: Self-pay

## 2019-09-25 ENCOUNTER — Ambulatory Visit: Payer: BC Managed Care – PPO | Attending: Family Medicine

## 2019-09-25 DIAGNOSIS — I890 Lymphedema, not elsewhere classified: Secondary | ICD-10-CM

## 2019-09-25 DIAGNOSIS — M6281 Muscle weakness (generalized): Secondary | ICD-10-CM

## 2019-09-25 DIAGNOSIS — R293 Abnormal posture: Secondary | ICD-10-CM

## 2019-09-25 DIAGNOSIS — M79601 Pain in right arm: Secondary | ICD-10-CM | POA: Diagnosis present

## 2019-09-25 NOTE — Patient Instructions (Signed)
Start with circles near neck above collarbones on each side 5-10 times each.  Deep Effective Breath   Standing, sitting, or laying down, place both hands on the belly. Take a deep breath IN, expanding the belly; then breath OUT, contracting the belly. Repeat __5__ times. Do __2-3__ sessions per day and before your self massage.  Axilla to Inguinal Nodes - Sweep   On involved side, make 5 circles at groin at panty line, then pump _5__ times from armpit along side of trunk to outer hip, making your pathway. Do __1_ time per day.  Arm Posterior: Elbow to Shoulder - Sweep   Pump _5__ times from back of elbow to top of shoulder. Then inner to outer upper arm _5_ times, then outer arm again _5_ times. Then back to the pathway _2-3_ times. Do _1__ time per day.  Copyright  VHI. All rights reserved.  ARM: [place] Wrist to Elbow - Sweep   Pump or stationary circles _5__ times from wrist to elbow making sure to do both sides of the forearm. Then retrace your steps to the outer arm, and the pathway _2-3_ times each. Do _1__ time per day.  ARM: Dorsum of Hand to Shoulder - Sweep   Pump or stationary circles _5__ times on back of hand including knuckle spaces and individual fingers if needed working up towards the wrist, then retrace all your steps working back up the forearm, doing both sides; upper outer arm and back to your pathway _2-3_ times each. Then do 5 circles again at uninvolved armpit and involved groin where you started! Good job!! Do __1_ time per day.

## 2019-09-25 NOTE — Therapy (Signed)
[place], [place], [address] Phone: [phone]   Fax:  [phone]  Physical Therapy Treatment  Patient Details  Name: Patricia Blevins MRN: [id number] Date of Birth: 06-18-1985 Referring Provider (PT): Horald Pollen   Encounter Date: 09/25/2019   PT End of Session - 09/25/19 1306    Visit Number 2    Number of Visits 11    Date for PT Re-Evaluation 10/16/19    PT Start Time 1205    PT Stop Time 1306    PT Time Calculation (min) 61 min    Activity Tolerance Patient tolerated treatment well    Behavior During Therapy [facility] for tasks assessed/performed           Past Medical History:  Diagnosis Date  . Asthma   . Melanoma [facility])     Past Surgical History:  Procedure Laterality Date  . lymph node removal    . melanoma removal  2008-2009    There were no vitals filed for this visit.   Subjective Assessment - 09/25/19 1220    Subjective I changed to doing the compression pump at night which did work better. I've ordered compression socks and they should arrive this week.    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 No/denies                             [facility] Adult PT Treatment/Exercise - 09/25/19 0001      Self-Care   Self-Care Other Self-Care Comments    Other Self-Care Comments  Discussed garment options with pt and showed her a Circaid velcro garment that she liked and thought she would be able to manage, especially as she will be able to take this off prn during day if it becomes bothersome due to weakness from other diagnosis. Demographics will be sent to [facility] for pt to be measured Friday here at clinic.       Manual Therapy   Manual Therapy  Manual Lymphatic Drainage (MLD)    Manual Lymphatic Drainage (MLD) In Supine: Short neck, 5 diaphragmatic breaths, Rt inguinal nodes, Rt axillo-inguinal anastomosis, then Rt UE including lateral upper arm, medial to lateral, lateral again, ant/post forearm, and dorsal hand and fingers then retraced all steps redirecting to anastomosis and beginning to instruct pt throughout and having her return demo. Was instructed she can do the same for the Lt UE as well at home.                   PT Education - 09/25/19 1502    Education Details Self MLD    Person(s) Educated Patient    Methods Explanation;Demonstration;Handout    Comprehension Verbalized understanding;Returned demonstration;Verbal cues required;Tactile cues required;Need further instruction               PT Long Term Goals - 09/11/19 1403      PT LONG TERM GOAL #1   Title Pt will have an understanding of a good schedule for daily management of lymphedema incluidng compression garments and pump use to decrease risk of infection.    Time 5    Period Weeks    Status New    Target  Date 10/16/19      PT LONG TERM GOAL #2   Title Pt will obtain appropriate compression garments for long term management of RUE lymphedema and bilateral LE swelling that she is able to don/doff and still use her extremities.    Time 5    Period Weeks    Status New    Target Date 10/16/19      PT LONG TERM GOAL #3   Title Pt will report 50% decrease in pain and discomfort in RUE to allow improved comfort.    Time 5    Period Weeks    Status New    Target Date 10/16/19      PT LONG TERM GOAL #4   Title Pt will be independent in self MLD for R UE for long term management of lymphedema.    Time 5    Period Weeks    Status New    Target Date 10/16/19                 Plan - 09/25/19 1503    Clinical Impression Statement Spent beginning of session discussing garment options with pt for Rt UE (and possibly Lt). She is interested in  pursuing velcro garment as light as possible that will still contain her fluid. Made appt for her to be measured this Friday with fitter [place] from [place] for this. Also issued [employer] phone number for pt to follow up with to see if her compression pump could use any updating as she has had it for about 6 years now. She has ordered compression stockings and these are due to arrive this week. then began first session of manual lymph drainage to Rt UE educating pt throughout regarding sequence and basic principles of MLD. Also had her return demo at end of session and issued handout. Pt will benefit from further review of same.    Personal Factors and Comorbidities Comorbidity 2    Comorbidities metastatic melanoma, polymyositis    Examination-Activity Limitations Locomotion Level;Stand    Examination-Participation Restrictions Laundry;Cleaning;Community Activity;Meal Prep;Occupation    Stability/Clinical Decision Making Stable/Uncomplicated    Rehab Potential Good    PT Frequency 2x / week    PT Duration 4 weeks    PT Treatment/Interventions ADLs/Self Care Home Management;DME Instruction;Therapeutic exercise;Therapeutic activities;Patient/family education;Manual lymph drainage;Manual techniques;Compression bandaging;Passive range of motion;Taping;Vasopneumatic Device    PT Next Visit Plan How did pt do with self MLD? Cont same and review assessing her technique; did compression stockings arrive/how do they fit?    PT Home Exercise Plan try using pump nightly, obtain compression shirt from [employer] and wear when exercising, try compression socks and see if they help; self MLD    Consulted and Agree with Plan of Care Patient           Patient will benefit from skilled therapeutic intervention in order to improve the following deficits and impairments:  Pain, Postural dysfunction, Impaired UE functional use, Decreased strength, Decreased knowledge of precautions, Increased edema  Visit  Diagnosis: Lymphedema, not elsewhere classified  Pain in right arm  Muscle weakness (generalized)  Abnormal posture     Problem List Patient Active Problem List   Diagnosis Date Noted  . Vitamin D deficiency 01/09/2019  . Elevated BP without diagnosis of hypertension 01/09/2019  . Taking multiple medications for chronic disease 01/09/2019  . Acute upper respiratory infection 10/05/2017  . Left otitis media 10/05/2017  . 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 Long-term use  of immunosuppressant medication 03/05/2016  . Multiple benign nevi of upper and lower extremities, and trunk 03/05/2016  . 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 Candidiasis 01/22/2016  . History of melanoma 09/12/2015  . Lymphedema of upper extremity following lymphadenectomy 07/31/2015  . Leg weakness, bilateral 07/31/2015  . Metastatic melanoma ([place]) 12/22/2010  . PM (polymyositis) ([place]) 12/22/2010    Otelia Limes, PTA 09/25/2019, 3:15 PM  [place], [place], [address] Phone: [phone]   Fax:  [phone]  Name: Arianne Klinge MRN: [id number] Date of Birth: Apr 11, 1985

## 2019-09-27 ENCOUNTER — Ambulatory Visit: Payer: BC Managed Care – PPO | Admitting: Physical Therapy

## 2019-09-27 ENCOUNTER — Other Ambulatory Visit: Payer: Self-pay

## 2019-09-27 DIAGNOSIS — I890 Lymphedema, not elsewhere classified: Secondary | ICD-10-CM | POA: Diagnosis not present

## 2019-09-27 DIAGNOSIS — M6281 Muscle weakness (generalized): Secondary | ICD-10-CM

## 2019-09-27 DIAGNOSIS — M79601 Pain in right arm: Secondary | ICD-10-CM

## 2019-09-27 DIAGNOSIS — R293 Abnormal posture: Secondary | ICD-10-CM

## 2019-09-27 NOTE — Therapy (Signed)
[place], [place], [address] Phone: [phone]   Fax:  [phone]  Physical Therapy Treatment  Patient Details  Name: Patricia Blevins MRN: [id number] Date of Birth: [birth year] Referring Provider (PT): Horald Pollen   Encounter Date: 09/27/2019   PT End of Session - 09/27/19 1928    Visit Number 3    Number of Visits 11    Date for PT Re-Evaluation 10/16/19    PT Start Time 2355    PT Stop Time 1450    PT Time Calculation (min) 45 min    Activity Tolerance Patient tolerated treatment well    Behavior During Therapy [facility] for tasks assessed/performed           Past Medical History:  Diagnosis Date   Asthma    Melanoma [facility])     Past Surgical History:  Procedure Laterality Date   lymph node removal     melanoma removal  2008-2009    There were no vitals filed for this visit.   Subjective Assessment - 09/27/19 1923    Subjective Pt states she got the compression socks and they did seem to help her. She is having difficutly doing self MLD becasue of muscle weakness and she cannot reach everywhere. She is interested in finding out if she can get her pump readjusted and new flexitouch pieces as they don't really fit her anymore. 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, anti PM-Scl polymyositis    Patient Stated Goals to figure out a routine for home for management of lymphedema, how often and when to use pump, how exercise affects the lymph system                             [facility] Adult PT Treatment/Exercise - 09/27/19 0001      Manual Therapy   Manual Therapy Edema management;Manual Lymphatic Drainage (MLD)    Edema Management email sent to team to coordinate reommendations of garments with fitter    Manual Lymphatic Drainage (MLD) In Supine: Short neck, 5 diaphragmatic breaths, Rt  inguinal nodes, Rt axillo-inguinal anastomosis, then Rt UE including lateral upper arm, medial to lateral, lateral again, ant/post forearm, and dorsal hand and fingers then retraced all steps redirecting to anastomosis .then to left sidelying for posterior intereaxillary anastamosis                       PT Long Term Goals - 09/11/19 1403      PT LONG TERM GOAL #1   Title Pt will have an understanding of a good schedule for daily management of lymphedema incluidng compression garments and pump use to decrease risk of infection.    Time 5    Period Weeks    Status New    Target Date 10/16/19      PT LONG TERM GOAL #2   Title Pt will obtain appropriate compression garments for long term management of RUE lymphedema and bilateral LE swelling that she is able to don/doff and still use her extremities.    Time 5    Period Weeks    Status New    Target Date 10/16/19      PT LONG TERM GOAL #3   Title Pt will report 50% decrease in pain and discomfort in RUE to allow improved comfort.  Time 5    Period Weeks    Status New    Target Date 10/16/19      PT LONG TERM GOAL #4   Title Pt will be independent in self MLD for R UE for long term management of lymphedema.    Time 5    Period Weeks    Status New    Target Date 10/16/19                 Plan - 09/27/19 1928    Clinical Impression Statement Performed MLD focusing on Rt. UE .  She does not have much pitting edema, though pt does state she gets compression marks on her arm.  lymphostatic fibrosis is softer in nature.  Discussed several options for compression with pt and communicated with team. Sent demographics to [employer] to assit with getting new Flexi garments as she has difficulty getting into them and the design has been updated    Personal Factors and Comorbidities Comorbidity 2    Comorbidities metastatic melanoma, polymyositis    Examination-Activity Limitations Locomotion Level;Stand     Examination-Participation Restrictions Laundry;Cleaning;Community Activity;Meal Prep;Occupation    Stability/Clinical Decision Making Stable/Uncomplicated    Rehab Potential Good    PT Frequency 2x / week    PT Duration 4 weeks    PT Treatment/Interventions ADLs/Self Care Home Management;DME Instruction;Therapeutic exercise;Therapeutic activities;Patient/family education;Manual lymph drainage;Manual techniques;Compression bandaging;Passive range of motion;Taping;Vasopneumatic Device    PT Next Visit Plan (Keep and eye on visits, pt states she only had 8 left after her last PT episode for aquatic therapy)  did pt hear from  [employer]? What garments did she decide to order? How did pt do with self MLD? Cont same and review assessing her technique; did compression stockings arrive/how do they fit?    PT Home Exercise Plan try using pump nightly, obtain compression shirt from [employer] and wear when exercising, try compression socks and see if they help; self MLD    Consulted and Agree with Plan of Care Patient           Patient will benefit from skilled therapeutic intervention in order to improve the following deficits and impairments:  Pain, Postural dysfunction, Impaired UE functional use, Decreased strength, Decreased knowledge of precautions, Increased edema  Visit Diagnosis: Lymphedema, not elsewhere classified  Pain in right arm  Muscle weakness (generalized)  Abnormal posture     Problem List Patient Active Problem List   Diagnosis Date Noted   Vitamin D deficiency 01/09/2019   Elevated BP without diagnosis of hypertension 01/09/2019   Taking multiple medications for chronic disease 01/09/2019   Acute upper respiratory infection 10/05/2017   Left otitis media 10/05/2017   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 Owens Shark PT  Norwood Levo 09/27/2019, 7:43 PM  [place], [place], [address] Phone: [phone]   Fax:  [phone]  Name: Patricia Blevins MRN: [id number] Date of Birth: 1985/07/08

## 2019-10-02 ENCOUNTER — Ambulatory Visit: Payer: BC Managed Care – PPO

## 2019-10-02 ENCOUNTER — Other Ambulatory Visit: Payer: Self-pay

## 2019-10-02 DIAGNOSIS — M6281 Muscle weakness (generalized): Secondary | ICD-10-CM

## 2019-10-02 DIAGNOSIS — M79601 Pain in right arm: Secondary | ICD-10-CM

## 2019-10-02 DIAGNOSIS — I890 Lymphedema, not elsewhere classified: Secondary | ICD-10-CM | POA: Diagnosis not present

## 2019-10-02 DIAGNOSIS — R293 Abnormal posture: Secondary | ICD-10-CM

## 2019-10-02 NOTE — Therapy (Signed)
[place], [place], [address] Phone: [phone]   Fax:  [phone]  Physical Therapy Treatment  Patient Details  Name: Patricia Blevins MRN: [id number] Date of Birth: October 11, 1985 Referring Provider (PT): Horald Pollen   Encounter Date: 10/02/2019   PT End of Session - 10/02/19 1106    Visit Number 4    Number of Visits 11    Date for PT Re-Evaluation 10/16/19    PT Start Time 1007    PT Stop Time 1104    PT Time Calculation (min) 57 min    Activity Tolerance Patient tolerated treatment well    Behavior During Therapy [facility] for tasks assessed/performed           Past Medical History:  Diagnosis Date  . Asthma   . Melanoma [facility])     Past Surgical History:  Procedure Laterality Date  . lymph node removal    . melanoma removal  2008-2009    There were no vitals filed for this visit.   Subjective Assessment - 10/02/19 1018    Subjective I want to only come 1x this week and next so I can save my last 2 appointments left with insurance so you guys can check my sleeve.    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 No/denies                             [facility] Adult PT Treatment/Exercise - 10/02/19 0001      Self-Care   Other Self-Care Comments  Demonstrated donning/doffing to answer pts questions for when hre garments arrive.       Manual Therapy   Manual Therapy Manual Lymphatic Drainage (MLD)    Manual Lymphatic Drainage (MLD) In Supine: Short neck, 5 diaphragmatic breaths, Rt inguinal nodes, Rt axillo-inguinal anastomosis, then Rt UE including lateral upper arm, medial to lateral, lateral again, ant/post forearm, and dorsal hand and fingers then retraced  all steps redirecting to anastomosis reviewing with pt while performing and having her return demo with hand over hand pressure and VCs to remind of "working phase/resting phase" as she was pulling skin back due to limited muscle control, so instructed pt to lift hand off skin after each motion and this improved her technique                       PT Long Term Goals - 09/11/19 1403      PT LONG TERM GOAL #1   Title Pt will have an understanding of a good schedule for daily management of lymphedema incluidng compression garments and pump use to decrease risk of infection.    Time 5    Period Weeks    Status New    Target Date 10/16/19      PT LONG TERM GOAL #2   Title Pt will obtain appropriate compression garments for long term management of RUE lymphedema and bilateral LE swelling that she is able to don/doff and still use her extremities.    Time 5    Period Weeks    Status New    Target Date 10/16/19      PT LONG TERM GOAL #3  Title Pt will report 50% decrease in pain and discomfort in RUE to allow improved comfort.    Time 5    Period Weeks    Status New    Target Date 10/16/19      PT LONG TERM GOAL #4   Title Pt will be independent in self MLD for R UE for long term management of lymphedema.    Time 5    Period Weeks    Status New    Target Date 10/16/19                 Plan - 10/02/19 1401    Clinical Impression Statement Reviewed with pt while performing correct technique of self MLD, spending extra time instructing in importance of "working/resting phase' of skin stretches. Some of pts difficulty is due to less muscle control from polymyositis but we problem solved that pt sitting up with arm propped on pillow will allow her more control. Pt has been measured for new garments and is awaiting arrival of these. Spent time at beginning of session answering her questions about how to don them if they arrive between next sessions. She is going to come 1  more time next week for more review of technique and then call us for next appt once her new garments arrive so we can assess.    Personal Factors and Comorbidities Comorbidity 2    Comorbidities metastatic melanoma, polymyositis    Examination-Activity Limitations Locomotion Level;Stand    Examination-Participation Restrictions Laundry;Cleaning;Community Activity;Meal Prep;Occupation    Stability/Clinical Decision Making Stable/Uncomplicated    Rehab Potential Good    PT Frequency 2x / week    PT Duration 4 weeks    PT Treatment/Interventions ADLs/Self Care Home Management;DME Instruction;Therapeutic exercise;Therapeutic activities;Patient/family education;Manual lymph drainage;Manual techniques;Compression bandaging;Passive range of motion;Taping;Vasopneumatic Device    PT Next Visit Plan (Keep and eye on visits, pt states she only had 8 left after her last PT episode for aquatic therapy)  did pt hear from  [employer]?  How did pt do with self MLD? Cont same and review assessing her technique; did compression stockings arrive/how do they fit?    PT Home Exercise Plan try using pump nightly, obtain compression shirt from [employer] and wear when exercising, try compression socks and see if they help; self MLD    Consulted and Agree with Plan of Care Patient           Patient will benefit from skilled therapeutic intervention in order to improve the following deficits and impairments:  Pain, Postural dysfunction, Impaired UE functional use, Decreased strength, Decreased knowledge of precautions, Increased edema  Visit Diagnosis: Lymphedema, not elsewhere classified  Pain in right arm  Muscle weakness (generalized)  Abnormal posture     Problem List Patient Active Problem List   Diagnosis Date Noted  . Vitamin D deficiency 01/09/2019  . Elevated BP without diagnosis of hypertension 01/09/2019  . Taking multiple medications for chronic disease 01/09/2019  . Acute upper respiratory  infection 10/05/2017  . Left otitis media 10/05/2017  . 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 Long-term use of immunosuppressant medication 03/05/2016  . Multiple benign nevi of upper and lower extremities, and trunk 03/05/2016  . 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 Candidiasis 01/22/2016  . History of melanoma 09/12/2015  . Lymphedema of upper extremity following lymphadenectomy 07/31/2015  . Leg weakness, bilateral 07/31/2015  . Metastatic melanoma ([place]) 12/22/2010  . PM (polymyositis) ([place]) 12/22/2010    Otelia Limes, PTA  10/02/2019, 2:06 PM  [place], [place], [address] Phone: [phone]   Fax:  [phone]  Name: Abbigayle Toole MRN: [id number] Date of Birth: 08/22/1985

## 2019-10-09 ENCOUNTER — Encounter: Payer: BC Managed Care – PPO | Admitting: Physical Therapy

## 2019-10-11 ENCOUNTER — Encounter: Payer: BC Managed Care – PPO | Admitting: Physical Therapy

## 2019-10-17 ENCOUNTER — Encounter: Payer: Self-pay | Admitting: Dermatology

## 2019-10-17 ENCOUNTER — Ambulatory Visit (INDEPENDENT_AMBULATORY_CARE_PROVIDER_SITE_OTHER): Payer: BC Managed Care – PPO | Admitting: Dermatology

## 2019-10-17 ENCOUNTER — Other Ambulatory Visit: Payer: Self-pay

## 2019-10-17 DIAGNOSIS — D229 Melanocytic nevi, unspecified: Secondary | ICD-10-CM

## 2019-10-17 DIAGNOSIS — L309 Dermatitis, unspecified: Secondary | ICD-10-CM

## 2019-10-17 DIAGNOSIS — Z85820 Personal history of malignant melanoma of skin: Secondary | ICD-10-CM

## 2019-10-17 DIAGNOSIS — L299 Pruritus, unspecified: Secondary | ICD-10-CM | POA: Diagnosis not present

## 2019-10-17 DIAGNOSIS — D225 Melanocytic nevi of trunk: Secondary | ICD-10-CM

## 2019-10-17 NOTE — Patient Instructions (Addendum)
First visit to see me for [facility] date of birth 06/14/1985.  She is remarkably good historian with a past history both of having had melanoma and perhaps a decade since diagnosed with polymyositis, currently being followed at [facility] by neurologist.  Treatment has included prednisone (initially as much is 60 mg but generally only 10 to 15 mg in the last few years), CellCept, and Rituximab which has recently been used on a as needed basis (but unfortunately which may have prevented her from developing antibodies to her Covid vaccine).  She has had perhaps 10 episodes of an itchy pink recurring rash on the central chest area.  A biopsy was obtained by Dr. Darron Doom which reportedly showed nonspecific findings.  Today there is no active inflammatory change.  We discussed 2 possibilities, one being recurring tinea versicolor and the other being an immune mediated breaking out.  I strongly favor the latter of these but there is no confirmatory test.  I will try and see Kritika within days if this flares; if this is impossible then she promised to send me a selfie.  Concurrently there is longstanding history of recurring vaginal yeast and more recently some vulvar itching without known yeast (her gynecologist was unable to find any active yeast).  She states that the itching of the chest and the vulvar feels similar, but the timing does not correlate.  She may Google search the word pramoxine and try any over-the-counter pramoxine cream (Goldbond antiitch or CeraVe itch relief may be 2 of them) to treat the vulvar area daily after bathing.  Because these usually  contain no hydrocortisone, she may use the pramoxine-based cream as many times that she wishes if it benefits her.  We will leave her follow-up visit open ended.  She was kind enough to also let me look at her back with there is neither rash nor any sign of melanoma.

## 2019-11-24 ENCOUNTER — Encounter: Payer: Self-pay | Admitting: Dermatology

## 2019-11-24 NOTE — Progress Notes (Signed)
   New Patient   Subjective  Patricia Blevins is a 34 y.o. female who presents for the following: Skin Problem (rash between breast-currently gone).  Rash Location: Chest Duration: Months Quality: Comes and goes Associated Signs/Symptoms: Modifying Factors: Clear today Severity:  Timing: No known precipitating factors Context: Polymyositis, chronic CellCept plus as needed rituximab   The following portions of the chart were reviewed this encounter and updated as appropriate: Tobacco  Allergies  Meds  Problems  Med Hx  Surg Hx  Fam Hx      Objective  Well appearing patient in no apparent distress; mood and affect are within normal limits.  All sun exposed areas plus back examined.   Assessment & Plan  Dermatitis Chest - [facility])  If possible, return when rash is active.  If not, please send photographs.  [facility] First visit to see me for [facility] date of birth 01-16-1985.  She is remarkably good historian with a past history both of having had melanoma and perhaps a decade since diagnosed with polymyositis, currently being followed at [facility] by neurologist.  Treatment has included prednisone (initially as much is 60 mg but generally only 10 to 15 mg in the last few years), CellCept, and Rituximab which has recently been used on a as needed basis (but unfortunately which may have prevented her from developing antibodies to her Covid vaccine).  She has had perhaps 10 episodes of an itchy pink recurring rash on the central chest area.  A biopsy was obtained by Dr. Darron Doom which reportedly showed nonspecific findings.  Today there is no active inflammatory change.  We discussed 2 possibilities, one being recurring tinea versicolor and the other being an immune mediated breaking out.  I strongly favor the latter of these but there is no confirmatory test.  I will try and see Patricia Blevins within days if this flares; if this is impossible then  she promised to send me a selfie.  Concurrently there is longstanding history of recurring vaginal yeast and more recently some vulvar itching without known yeast (her gynecologist was unable to find any active yeast).  She states that the itching of the chest and the vulvar feels similar, but the timing does not correlate.  She may Google search the word pramoxine and try any over-the-counter pramoxine cream (Goldbond antiitch or CeraVe itch relief may be 2 of them) to treat the vulvar area daily after bathing.  Because these usually  contain no hydrocortisone, she may use the pramoxine-based cream as many times that she wishes if it benefits her.  We will leave her follow-up visit open ended.  She was kind enough to also let me look at her back with there is neither rash nor any sign of melanoma.

## 2020-11-05 ENCOUNTER — Other Ambulatory Visit: Payer: Self-pay | Admitting: Obstetrics & Gynecology

## 2020-12-24 ENCOUNTER — Other Ambulatory Visit: Payer: Self-pay | Admitting: Obstetrics & Gynecology

## 2020-12-24 NOTE — Pre-Procedure Instructions (Addendum)
Surgical Instructions    Your procedure is scheduled on Monday 12/29/20.   Report to [facility] Main Entrance "A" at 07:15 A.M., then check in with the Admitting office.  Call this number if you have problems the morning of surgery:  [phone]   If you have any questions prior to your surgery date call [phone]: Open Monday-Friday 8am-4pm    Remember:  Do not eat or drink after midnight the night before your surgery     Take these medicines the morning of surgery with A SIP OF WATER   predniSONE (DELTASONE)  valACYclovir (VALTREX)    acetaminophen (TYLENOL)- If needed  clonazePAM (KLONOPIN)- If needed  promethazine (PHENERGAN)- If needed  Please follow your physician's instructions regarding mycophenolate (CELLCEPT)-immunosuppressant.   As of today, STOP taking any Aspirin (unless otherwise instructed by your surgeon) Aleve, Naproxen, Ibuprofen, Motrin, Advil, Goody's, BC's, all herbal medications, fish oil, and all vitamins.     After your COVID test   You are not required to quarantine however you are required to wear a well-fitting mask when you are out and around people not in your household.  If your mask becomes wet or soiled, replace with a new one.  Wash your hands often with soap and water for 20 seconds or clean your hands with an alcohol-based hand sanitizer that contains at least 60% alcohol.  Do not share personal items.  Notify your provider: if you are in close contact with someone who has COVID  or if you develop a fever of 100.4 or greater, sneezing, cough, sore throat, shortness of breath or body aches.             Do not wear jewelry or makeup Do not wear lotions, powders, perfumes/colognes, or deodorant. Do not shave 48 hours prior to surgery.  Men may shave face and neck. Do not bring valuables to the hospital. DO Not wear nail polish, gel polish, artificial nails, or any other type of covering on natural nails including finger and  toenails. If patients have artificial nails, gel coating, etc. that need to be removed by a nail salon, please have this removed prior to surgery or surgery may need to be canceled/delayed if the surgeon/ anesthesia feels like the patient is unable to be adequately monitored.             [facility] is not responsible for any belongings or valuables.  Do NOT Smoke (Tobacco/Vaping)  24 hours prior to your procedure  If you use a CPAP at night, you may bring your mask for your overnight stay.   Contacts, glasses, hearing aids, dentures or partials may not be worn into surgery, please bring cases for these belongings   For patients admitted to the hospital, discharge time will be determined by your treatment team.   Patients discharged the day of surgery will not be allowed to drive home, and someone needs to stay with them for 24 hours.  NO VISITORS WILL BE ALLOWED IN PRE-OP WHERE PATIENTS ARE PREPPED FOR SURGERY.  ONLY 1 SUPPORT PERSON MAY BE PRESENT IN THE WAITING ROOM WHILE YOU ARE IN SURGERY.  IF YOU ARE TO BE ADMITTED, ONCE YOU ARE IN YOUR ROOM YOU WILL BE ALLOWED TWO (2) VISITORS. 1 (ONE) VISITOR MAY STAY OVERNIGHT BUT MUST ARRIVE TO THE ROOM BY 8pm.  Minor children may have two parents present. Special consideration for safety and communication needs will be reviewed on a case by case basis.  Special instructions:  Oral Hygiene is also important to reduce your risk of infection.  Remember - BRUSH YOUR TEETH THE MORNING OF SURGERY WITH YOUR REGULAR TOOTHPASTE   [place]- Preparing For Surgery  Before surgery, you can play an important role. Because skin is not sterile, your skin needs to be as free of germs as possible. You can reduce the number of germs on your skin by washing with CHG (chlorahexidine gluconate) Soap before surgery.  CHG is an antiseptic cleaner which kills germs and bonds with the skin to continue killing germs even after washing.     Please do not use if you  have an allergy to CHG or antibacterial soaps. If your skin becomes reddened/irritated stop using the CHG.  Do not shave (including legs and underarms) for at least 48 hours prior to first CHG shower. It is OK to shave your face.  Please follow these instructions carefully.     Shower the NIGHT BEFORE SURGERY and the MORNING OF SURGERY with CHG Soap.   If you chose to wash your hair, wash your hair first as usual with your normal shampoo. After you shampoo, rinse your hair and body thoroughly to remove the shampoo.  Then [employer] and genitals (private parts) with your normal soap and rinse thoroughly to remove soap.  After that Use CHG Soap as you would any other liquid soap. You can apply CHG directly to the skin and wash gently with a scrungie or a clean washcloth.   Apply the CHG Soap to your body ONLY FROM THE NECK DOWN.  Do not use on open wounds or open sores. Avoid contact with your eyes, ears, mouth and genitals (private parts). Wash Face and genitals (private parts)  with your normal soap.   Wash thoroughly, paying special attention to the area where your surgery will be performed.  Thoroughly rinse your body with warm water from the neck down.  DO NOT shower/wash with your normal soap after using and rinsing off the CHG Soap.  Pat yourself dry with a CLEAN TOWEL.  Wear CLEAN PAJAMAS to bed the night before surgery  Place CLEAN SHEETS on your bed the night before your surgery  DO NOT SLEEP WITH PETS.   Day of Surgery:  Take a shower with CHG soap. Wear Clean/Comfortable clothing the morning of surgery Do not apply any deodorants/lotions.   Remember to brush your teeth WITH YOUR REGULAR TOOTHPASTE.   Please read over the following fact sheets that you were given.

## 2020-12-25 ENCOUNTER — Encounter (HOSPITAL_COMMUNITY): Payer: Self-pay

## 2020-12-25 ENCOUNTER — Encounter (HOSPITAL_COMMUNITY)
Admission: RE | Admit: 2020-12-25 | Discharge: 2020-12-25 | Disposition: A | Discharge status: Home / Self Care | Payer: BC Managed Care – PPO | Source: Ambulatory Visit | Attending: Obstetrics & Gynecology | Admitting: Obstetrics & Gynecology

## 2020-12-25 ENCOUNTER — Other Ambulatory Visit: Payer: Self-pay

## 2020-12-25 VITALS — BP 130/84 | HR 80 | Temp 97.7°F | Resp 17 | Ht 67.0 in | Wt 207.0 lb

## 2020-12-25 DIAGNOSIS — F1211 Cannabis abuse, in remission: Secondary | ICD-10-CM | POA: Insufficient documentation

## 2020-12-25 DIAGNOSIS — N939 Abnormal uterine and vaginal bleeding, unspecified: Secondary | ICD-10-CM | POA: Diagnosis not present

## 2020-12-25 DIAGNOSIS — F1411 Cocaine abuse, in remission: Secondary | ICD-10-CM | POA: Insufficient documentation

## 2020-12-25 DIAGNOSIS — J45909 Unspecified asthma, uncomplicated: Secondary | ICD-10-CM | POA: Diagnosis not present

## 2020-12-25 DIAGNOSIS — Z01812 Encounter for preprocedural laboratory examination: Secondary | ICD-10-CM | POA: Insufficient documentation

## 2020-12-25 DIAGNOSIS — Z85820 Personal history of malignant melanoma of skin: Secondary | ICD-10-CM | POA: Insufficient documentation

## 2020-12-25 DIAGNOSIS — M3313 Other dermatomyositis without myopathy: Secondary | ICD-10-CM | POA: Insufficient documentation

## 2020-12-25 DIAGNOSIS — Z20822 Contact with and (suspected) exposure to covid-19: Secondary | ICD-10-CM | POA: Insufficient documentation

## 2020-12-25 DIAGNOSIS — Z7952 Long term (current) use of systemic steroids: Secondary | ICD-10-CM | POA: Insufficient documentation

## 2020-12-25 DIAGNOSIS — Z01818 Encounter for other preprocedural examination: Secondary | ICD-10-CM

## 2020-12-25 HISTORY — DX: Depression, unspecified: F32.A

## 2020-12-25 HISTORY — DX: Pneumonia, unspecified organism: J18.9

## 2020-12-25 HISTORY — DX: Anxiety disorder, unspecified: F41.9

## 2020-12-25 HISTORY — DX: Other dermatomyositis without myopathy: M33.13

## 2020-12-25 HISTORY — DX: Other psychoactive substance abuse, uncomplicated: F19.10

## 2020-12-25 LAB — TYPE AND SCREEN
ABO/RH(D): A NEG
Antibody Screen: NEGATIVE

## 2020-12-25 LAB — CBC
HCT: 40.7 % (ref 36.0–46.0)
Hemoglobin: 13.2 g/dL (ref 12.0–15.0)
MCH: 31.5 pg (ref 26.0–34.0)
MCHC: 32.4 g/dL (ref 30.0–36.0)
MCV: 97.1 fL (ref 80.0–100.0)
Platelets: 292 10*3/uL (ref 150–400)
RBC: 4.19 MIL/uL (ref 3.87–5.11)
RDW: 13.1 % (ref 11.5–15.5)
WBC: 12.6 10*3/uL — ABNORMAL HIGH (ref 4.0–10.5)
nRBC: 0 % (ref 0.0–0.2)

## 2020-12-25 LAB — BASIC METABOLIC PANEL
Anion gap: 8 (ref 5–15)
BUN: 8 mg/dL (ref 6–20)
CO2: 25 mmol/L (ref 22–32)
Calcium: 9 mg/dL (ref 8.9–10.3)
Chloride: 106 mmol/L (ref 98–111)
Creatinine, Ser: 0.49 mg/dL (ref 0.44–1.00)
GFR, Estimated: >60 mL/min (ref >60)
Glucose, Bld: 106 mg/dL — ABNORMAL HIGH (ref 70–99)
Potassium: 3.5 mmol/L (ref 3.5–5.1)
Sodium: 139 mmol/L (ref 135–145)

## 2020-12-25 NOTE — Progress Notes (Signed)
PCP - Dr. Horald Pollen Cardiologist - Denies Neuro: Dr. Venita Sheffield  PPM/ICD - Denies   Chest x-ray - N/A EKG - N/A Stress Test - Denies ECHO - Denies Cardiac Cath - Denies  Sleep Study - Denies  Diabetic: Denies  Blood Thinner Instructions: N/A Aspirin Instructions: N/A  ERAS Protcol - No  COVID TEST- 12/25/20 in PAT   Anesthesia review: Yes, hx of neuromuscular disease  Patient denies shortness of breath, fever, cough and chest pain at PAT appointment   All instructions explained to the patient, with a verbal understanding of the material. Patient agrees to go over the instructions while at home for a better understanding. Patient also instructed to self quarantine after being tested for COVID-19. The opportunity to ask questions was provided.

## 2020-12-26 LAB — SARS CORONAVIRUS 2 (TAT 6-24 HRS): SARS Coronavirus 2: NEGATIVE

## 2020-12-26 NOTE — Anesthesia Preprocedure Evaluation (Addendum)
Anesthesia Evaluation  Patient identified by MRN, date of birth, ID band Patient awake    Reviewed: Allergy & Precautions, NPO status , Patient's Chart, lab work & pertinent test results  Airway Mallampati: II  TM Distance: >3 FB Neck ROM: Full    Dental no notable dental hx.    Pulmonary asthma ,    Pulmonary exam normal breath sounds clear to auscultation       Cardiovascular negative cardio ROS Normal cardiovascular exam Rhythm:Regular Rate:Normal     Neuro/Psych Anxiety Depression negative neurological ROS  negative psych ROS   GI/Hepatic negative GI ROS, Neg liver ROS,   Endo/Other  negative endocrine ROS  Renal/GU negative Renal ROS  negative genitourinary   Musculoskeletal negative musculoskeletal ROS (+)   Abdominal (+) + obese,   Peds negative pediatric ROS (+)  Hematology negative hematology ROS (+)   Anesthesia Other Findings   Reproductive/Obstetrics negative OB ROS                             Anesthesia Physical Anesthesia Plan  ASA: 3  Anesthesia Plan: General   Post-op Pain Management:    Induction: Intravenous  PONV Risk Score and Plan: 3 and Ondansetron, Dexamethasone, Midazolam and Treatment may vary due to age or medical condition  Airway Management Planned: Oral ETT  Additional Equipment:   Intra-op Plan:   Post-operative Plan: Extubation in OR  Informed Consent: I have reviewed the patients History and Physical, chart, labs and discussed the procedure including the risks, benefits and alternatives for the proposed anesthesia with the patient or authorized representative who has indicated his/her understanding and acceptance.     Dental advisory given  Plan Discussed with: CRNA  Anesthesia Plan Comments: (PAT note written 12/26/2020 by Myra Gianotti, PA-C.  History stage III melanoma with RUE lymphedema, Polymyositis/Dermatomyositis Syndrome,  chronic prednisone/immunosuppressive therapy.Marland Kitchen)     Anesthesia Quick Evaluation

## 2020-12-26 NOTE — Progress Notes (Signed)
Anesthesia Chart Review:  Case: [id number] Date/Time: 12/29/20 0900   Procedure: TOTAL LAPAROSCOPIC HYSTERECTOMY WITH SALPINGECTOMY - POSSIBLE SALPINGECTOMY   Anesthesia type: Choice   Pre-op diagnosis: ABNORMAL UTERINE/VAGINAL BLEEDING   Location: [facility] OR ROOM 07 / [place] [facility]   Surgeons: Sanjuana Kava, MD       DISCUSSION: Patient is a 35 year old biologic female (non-binary gender; pronouns: name/they/them/theirs) scheduled for the above procedure.  History includes never smoker, asthma, substance abuse (marijuana & cocaine use in there 73'U; no current illicit drug or alcohol use documented 12/26/20), melanoma (stage III T1N1M0 s/p excision right upper back, s/p bilateral axillary lymph nodes resection ~ 2009, RUE lymphedema), PM-SCL dermatomyositis syndrome (initial diagnosis polymyositis 2004, revised to dermatomyositis given characteristic rash; on Cellcept, prednisone 10 mg daily; received methylprednisoone IM & TruXima infusion 12/16/20).   12/25/20/ presurgical COVID-19 test negative. Anesthesia team to evaluate on the day of surgery. Anesthesia considerations: RUE lymphedema and PM-SCL dermatomyositis syndrome history, and chronic prednisone/immunosuppressive therapy.    VS: BP 130/84    Pulse 80    Temp 36.5 C (Oral)    Resp 17    Ht 5\' 7"  (1.702 m)    Wt 93.9 kg    LMP 12/24/2020    SpO2 100%    BMI 32.42 kg/m    PROVIDERS: Hayden Rasmussen, MD is PCP  - Venita Sheffield, MD is neurologist ([facility]). Last visit 08/28/20. Continue mycophenolate mofetil 1000mg  BID and prednisone 10mg  daily. Follow-up ~ 4 months.  Beryle Lathe, MD is dermatologist ([facility]). Last visit 05/28/20. Bonnita Hollow, MD is allergist ([facility]). 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 Additional labs ordered. Evusheld dosing Q six months planned.  - Has pending rheumatology consult with Rosina Lowenstein, NP at [facility].   LABS: Labs reviewed: Acceptable for surgery. They are for urine pregnancy test on the  day of surgery as well as 2nd sample T&S.  (all labs ordered are listed, but only abnormal results are displayed)  Labs Reviewed  CBC - Abnormal; Notable for the following components:      Result Value   WBC 12.6 (*)    All other components within normal limits  BASIC METABOLIC PANEL - Abnormal; Notable for the following components:   Glucose, Bld 106 (*)    All other components within normal limits  SARS CORONAVIRUS 2 (TAT 6-24 HRS)  TYPE AND SCREEN    IMAGES: MRI myositis protocol pelvis/proximal femurs 08/27/20 ([facility]): Impression:  Marked fatty infiltration/atrophy pelvic/thigh musculature with possible  subtle intramuscular edema at the distal left greater than right quadriceps  muscle groups.     EKG: N/A   CV: N/A  Past Medical History:  Diagnosis Date   Anxiety    Asthma    Depression    Dermatomyositis ([place])    Melanoma ([place])    Pneumonia    Substance abuse ([place])    No drink since October    Past Surgical History:  Procedure Laterality Date   lymph node removal     melanoma removal  2008-2009   SKIN BIOPSY  2021   Between breast for psoriasis   THIGH / KNEE SOFT TISSUE BIOPSY Right 2000    MEDICATIONS:  acetaminophen (TYLENOL) 325 MG tablet   calcium carbonate (TUMS - DOSED IN MG ELEMENTAL CALCIUM) 500 MG chewable tablet   Calcium Carbonate-Vit D-Min (CALCIUM 600+D3 PLUS MINERALS) 600-800 MG-UNIT TABS   clonazePAM (KLONOPIN) 0.5 MG tablet   gabapentin (NEURONTIN) 100 MG capsule  ibuprofen (ADVIL) 200 MG tablet   Lactobacillus (PROBIOTIC ACIDOPHILUS) CAPS   Multiple Vitamin (MULTIVITAMIN WITH MINERALS) TABS tablet   mycophenolate (CELLCEPT) 500 MG tablet   predniSONE (DELTASONE) 5 MG tablet   promethazine (PHENERGAN) 12.5 MG tablet   riTUXimab (RITUXAN IV)   valACYclovir (VALTREX) 500 MG tablet   No current facility-administered medications for this encounter.    Myra Gianotti, PA-C Surgical Short Stay/Anesthesiology [facility] Phone [phone] [facility] Phone [phone] 12/26/2020 10:35 AM

## 2020-12-29 ENCOUNTER — Other Ambulatory Visit: Payer: Self-pay

## 2020-12-29 ENCOUNTER — Ambulatory Visit (HOSPITAL_COMMUNITY): Payer: BC Managed Care – PPO | Admitting: Vascular Surgery

## 2020-12-29 ENCOUNTER — Encounter (HOSPITAL_COMMUNITY)
Admission: RE | Disposition: A | Discharge status: Home / Self Care | Payer: Self-pay | Source: Home / Self Care | Attending: Obstetrics & Gynecology

## 2020-12-29 ENCOUNTER — Ambulatory Visit (HOSPITAL_COMMUNITY)
Admission: RE | Admit: 2020-12-29 | Discharge: 2020-12-30 | Disposition: A | Discharge status: Home / Self Care | Payer: BC Managed Care – PPO | Attending: Obstetrics & Gynecology | Admitting: Obstetrics & Gynecology

## 2020-12-29 ENCOUNTER — Ambulatory Visit (HOSPITAL_COMMUNITY): Payer: BC Managed Care – PPO | Admitting: Certified Registered Nurse Anesthetist

## 2020-12-29 DIAGNOSIS — N938 Other specified abnormal uterine and vaginal bleeding: Secondary | ICD-10-CM | POA: Insufficient documentation

## 2020-12-29 DIAGNOSIS — F419 Anxiety disorder, unspecified: Secondary | ICD-10-CM | POA: Diagnosis not present

## 2020-12-29 DIAGNOSIS — F32A Depression, unspecified: Secondary | ICD-10-CM | POA: Diagnosis not present

## 2020-12-29 DIAGNOSIS — J45909 Unspecified asthma, uncomplicated: Secondary | ICD-10-CM | POA: Diagnosis not present

## 2020-12-29 DIAGNOSIS — N946 Dysmenorrhea, unspecified: Secondary | ICD-10-CM | POA: Diagnosis not present

## 2020-12-29 DIAGNOSIS — N939 Abnormal uterine and vaginal bleeding, unspecified: Secondary | ICD-10-CM | POA: Diagnosis present

## 2020-12-29 DIAGNOSIS — Z90710 Acquired absence of both cervix and uterus: Secondary | ICD-10-CM | POA: Diagnosis present

## 2020-12-29 HISTORY — PX: TOTAL LAPAROSCOPIC HYSTERECTOMY WITH SALPINGECTOMY: SHX6742

## 2020-12-29 HISTORY — PX: CYSTOSCOPY: SHX5120

## 2020-12-29 LAB — POCT PREGNANCY, URINE: Preg Test, Ur: NEGATIVE

## 2020-12-29 LAB — ABO/RH: ABO/RH(D): A NEG

## 2020-12-29 SURGERY — HYSTERECTOMY, TOTAL, LAPAROSCOPIC, WITH SALPINGECTOMY
Anesthesia: General | Site: Bladder

## 2020-12-29 MED ORDER — HYDROMORPHONE HCL 1 MG/ML IJ SOLN
0.2500 mg | INTRAMUSCULAR | Status: DC | PRN
  Administered 2020-12-29 (×2): 0.5 mg via INTRAVENOUS

## 2020-12-29 MED ORDER — PHENYLEPHRINE HCL-NACL 20-0.9 MG/250ML-% IV SOLN
INTRAVENOUS | Status: DC | PRN
  Administered 2020-12-29: 25 ug/min via INTRAVENOUS

## 2020-12-29 MED ORDER — PROMETHAZINE HCL 25 MG PO TABS: 12.5000 mg | ORAL_TABLET | Freq: Three times a day (TID) | ORAL | Status: DC | PRN

## 2020-12-29 MED ORDER — CEFAZOLIN SODIUM-DEXTROSE 2-4 GM/100ML-% IV SOLN
2.0000 g | INTRAVENOUS | Status: AC
  Administered 2020-12-29: 10:00:00 2 g via INTRAVENOUS
  Filled 2020-12-29: qty 100

## 2020-12-29 MED ORDER — SIMETHICONE 80 MG PO CHEW
80.0000 mg | CHEWABLE_TABLET | Freq: Four times a day (QID) | ORAL | Status: DC
  Administered 2020-12-29 – 2020-12-30 (×3): 80 mg via ORAL
  Filled 2020-12-29 (×3): qty 1

## 2020-12-29 MED ORDER — LACTATED RINGERS IV SOLN: INTRAVENOUS | Status: DC

## 2020-12-29 MED ORDER — SODIUM CHLORIDE 0.9 % IV SOLN
25.0000 mg | Freq: Four times a day (QID) | INTRAVENOUS | Status: DC | PRN
  Filled 2020-12-29 (×2): qty 1

## 2020-12-29 MED ORDER — EPHEDRINE SULFATE-NACL 50-0.9 MG/10ML-% IV SOSY
PREFILLED_SYRINGE | INTRAVENOUS | Status: DC | PRN
  Administered 2020-12-29: 5 mg via INTRAVENOUS

## 2020-12-29 MED ORDER — IBUPROFEN 600 MG PO TABS: 600.0000 mg | ORAL_TABLET | Freq: Four times a day (QID) | ORAL | Status: DC

## 2020-12-29 MED ORDER — ORAL CARE MOUTH RINSE: 15.0000 mL | Freq: Once | OROMUCOSAL | Status: AC

## 2020-12-29 MED ORDER — CHLORHEXIDINE GLUCONATE 0.12 % MT SOLN
15.0000 mL | Freq: Once | OROMUCOSAL | Status: AC
  Administered 2020-12-29: 08:00:00 15 mL via OROMUCOSAL
  Filled 2020-12-29: qty 15

## 2020-12-29 MED ORDER — SCOPOLAMINE 1 MG/3DAYS TD PT72
MEDICATED_PATCH | TRANSDERMAL | Status: DC | PRN
  Administered 2020-12-29: 1 via TRANSDERMAL

## 2020-12-29 MED ORDER — PANTOPRAZOLE SODIUM 40 MG PO TBEC
40.0000 mg | DELAYED_RELEASE_TABLET | Freq: Every day | ORAL | Status: DC
  Administered 2020-12-29 – 2020-12-30 (×2): 40 mg via ORAL
  Filled 2020-12-29 (×2): qty 1

## 2020-12-29 MED ORDER — FENTANYL CITRATE (PF) 250 MCG/5ML IJ SOLN
INTRAMUSCULAR | Status: DC | PRN
  Administered 2020-12-29 (×2): 50 ug via INTRAVENOUS
  Administered 2020-12-29: 100 ug via INTRAVENOUS
  Administered 2020-12-29: 50 ug via INTRAVENOUS

## 2020-12-29 MED ORDER — KETOROLAC TROMETHAMINE 30 MG/ML IJ SOLN
INTRAMUSCULAR | Status: AC
  Filled 2020-12-29: qty 1

## 2020-12-29 MED ORDER — OXYCODONE HCL 5 MG PO TABS
5.0000 mg | ORAL_TABLET | ORAL | Status: DC | PRN
  Administered 2020-12-29 – 2020-12-30 (×3): 5 mg via ORAL
  Filled 2020-12-29 (×3): qty 1

## 2020-12-29 MED ORDER — BUPIVACAINE HCL (PF) 0.25 % IJ SOLN
INTRAMUSCULAR | Status: AC
  Filled 2020-12-29: qty 30

## 2020-12-29 MED ORDER — MYCOPHENOLATE MOFETIL 250 MG PO CAPS: 500.0000 mg | ORAL_CAPSULE | Freq: Two times a day (BID) | ORAL | Status: DC

## 2020-12-29 MED ORDER — OXYCODONE HCL 5 MG PO TABS: 5.0000 mg | ORAL_TABLET | Freq: Once | ORAL | Status: DC | PRN

## 2020-12-29 MED ORDER — MENTHOL 3 MG MT LOZG: 1.0000 | LOZENGE | OROMUCOSAL | Status: DC | PRN

## 2020-12-29 MED ORDER — ACETAMINOPHEN 500 MG PO TABS
1000.0000 mg | ORAL_TABLET | Freq: Four times a day (QID) | ORAL | Status: DC
  Administered 2020-12-29 – 2020-12-30 (×3): 1000 mg via ORAL
  Filled 2020-12-29 (×3): qty 2

## 2020-12-29 MED ORDER — METHYLENE BLUE 0.5 % INJ SOLN
INTRAVENOUS | Status: AC
  Filled 2020-12-29: qty 10

## 2020-12-29 MED ORDER — AMISULPRIDE (ANTIEMETIC) 5 MG/2ML IV SOLN
INTRAVENOUS | Status: AC
  Filled 2020-12-29: qty 4

## 2020-12-29 MED ORDER — OXYCODONE HCL 5 MG/5ML PO SOLN: 5.0000 mg | Freq: Once | ORAL | Status: DC | PRN

## 2020-12-29 MED ORDER — SODIUM CHLORIDE 0.9 % IR SOLN
Status: DC | PRN
  Administered 2020-12-29: 3000 mL

## 2020-12-29 MED ORDER — MIDAZOLAM HCL 2 MG/2ML IJ SOLN
INTRAMUSCULAR | Status: AC
  Filled 2020-12-29: qty 2

## 2020-12-29 MED ORDER — CLONAZEPAM 0.5 MG PO TABS
0.5000 mg | ORAL_TABLET | Freq: Two times a day (BID) | ORAL | Status: DC | PRN
  Administered 2020-12-29: 22:00:00 0.5 mg via ORAL
  Filled 2020-12-29: qty 1

## 2020-12-29 MED ORDER — MIDAZOLAM HCL 5 MG/5ML IJ SOLN
INTRAMUSCULAR | Status: DC | PRN
  Administered 2020-12-29: 2 mg via INTRAVENOUS

## 2020-12-29 MED ORDER — MYCOPHENOLATE MOFETIL 250 MG PO CAPS
1000.0000 mg | ORAL_CAPSULE | Freq: Two times a day (BID) | ORAL | Status: DC
  Administered 2020-12-29 – 2020-12-30 (×2): 1000 mg via ORAL
  Filled 2020-12-29 (×2): qty 4

## 2020-12-29 MED ORDER — GABAPENTIN 300 MG PO CAPS
300.0000 mg | ORAL_CAPSULE | Freq: Every day | ORAL | Status: DC
  Administered 2020-12-29: 22:00:00 300 mg via ORAL
  Filled 2020-12-29: qty 1

## 2020-12-29 MED ORDER — DEXAMETHASONE SODIUM PHOSPHATE 10 MG/ML IJ SOLN
INTRAMUSCULAR | Status: DC | PRN
  Administered 2020-12-29: 5 mg via INTRAVENOUS

## 2020-12-29 MED ORDER — 0.9 % SODIUM CHLORIDE (POUR BTL) OPTIME
TOPICAL | Status: DC | PRN
  Administered 2020-12-29: 10:00:00 500 mL

## 2020-12-29 MED ORDER — GLYCOPYRROLATE PF 0.2 MG/ML IJ SOSY
PREFILLED_SYRINGE | INTRAMUSCULAR | Status: DC | PRN
  Administered 2020-12-29 (×2): .1 mg via INTRAVENOUS

## 2020-12-29 MED ORDER — ROCURONIUM BROMIDE 10 MG/ML (PF) SYRINGE
PREFILLED_SYRINGE | INTRAVENOUS | Status: DC | PRN
  Administered 2020-12-29: 100 mg via INTRAVENOUS
  Administered 2020-12-29 (×2): 10 mg via INTRAVENOUS

## 2020-12-29 MED ORDER — KETOROLAC TROMETHAMINE 30 MG/ML IJ SOLN
30.0000 mg | Freq: Once | INTRAMUSCULAR | Status: AC
  Administered 2020-12-29: 13:00:00 30 mg via INTRAVENOUS

## 2020-12-29 MED ORDER — HEMOSTATIC AGENTS (NO CHARGE) OPTIME
TOPICAL | Status: DC | PRN
  Administered 2020-12-29: 1 via TOPICAL

## 2020-12-29 MED ORDER — MEPERIDINE HCL 25 MG/ML IJ SOLN: 6.2500 mg | INTRAMUSCULAR | Status: DC | PRN

## 2020-12-29 MED ORDER — METHYLENE BLUE 0.5 % INJ SOLN
INTRAVENOUS | Status: DC | PRN
  Administered 2020-12-29: 50 mg via INTRAVENOUS

## 2020-12-29 MED ORDER — BUPIVACAINE HCL (PF) 0.25 % IJ SOLN
INTRAMUSCULAR | Status: DC | PRN
  Administered 2020-12-29: 13 mL

## 2020-12-29 MED ORDER — LACTATED RINGERS IV SOLN: INTRAVENOUS | Status: DC | PRN

## 2020-12-29 MED ORDER — PREDNISONE 10 MG PO TABS
10.0000 mg | ORAL_TABLET | Freq: Every day | ORAL | Status: DC
  Administered 2020-12-30: 08:00:00 10 mg via ORAL
  Filled 2020-12-29: qty 1

## 2020-12-29 MED ORDER — PROMETHAZINE HCL 25 MG/ML IJ SOLN: 6.2500 mg | INTRAMUSCULAR | Status: DC | PRN

## 2020-12-29 MED ORDER — FENTANYL CITRATE (PF) 250 MCG/5ML IJ SOLN
INTRAMUSCULAR | Status: AC
  Filled 2020-12-29: qty 5

## 2020-12-29 MED ORDER — HYDROMORPHONE HCL 1 MG/ML IJ SOLN
INTRAMUSCULAR | Status: AC
  Filled 2020-12-29: qty 1

## 2020-12-29 MED ORDER — KETOROLAC TROMETHAMINE 30 MG/ML IJ SOLN
30.0000 mg | Freq: Four times a day (QID) | INTRAMUSCULAR | Status: DC
  Administered 2020-12-29 – 2020-12-30 (×3): 30 mg via INTRAVENOUS
  Filled 2020-12-29 (×3): qty 1

## 2020-12-29 MED ORDER — SUGAMMADEX SODIUM 200 MG/2ML IV SOLN
INTRAVENOUS | Status: DC | PRN
  Administered 2020-12-29: 200 mg via INTRAVENOUS

## 2020-12-29 MED ORDER — HYDROMORPHONE HCL 1 MG/ML IJ SOLN: 0.2000 mg | INTRAMUSCULAR | Status: DC | PRN

## 2020-12-29 MED ORDER — VALACYCLOVIR HCL 500 MG PO TABS
1000.0000 mg | ORAL_TABLET | Freq: Every day | ORAL | Status: DC
  Administered 2020-12-30: 08:00:00 1000 mg via ORAL
  Filled 2020-12-29: qty 2

## 2020-12-29 MED ORDER — PROPOFOL 10 MG/ML IV BOLUS
INTRAVENOUS | Status: DC | PRN
  Administered 2020-12-29: 150 mg via INTRAVENOUS

## 2020-12-29 MED ORDER — ONDANSETRON HCL 4 MG/2ML IJ SOLN
INTRAMUSCULAR | Status: DC | PRN
  Administered 2020-12-29: 4 mg via INTRAVENOUS

## 2020-12-29 MED ORDER — AMISULPRIDE (ANTIEMETIC) 5 MG/2ML IV SOLN
10.0000 mg | Freq: Once | INTRAVENOUS | Status: AC | PRN
  Administered 2020-12-29: 13:00:00 10 mg via INTRAVENOUS

## 2020-12-29 MED ORDER — HYDROCORTISONE SOD SUC (PF) 100 MG IJ SOLR
INTRAMUSCULAR | Status: DC | PRN
  Administered 2020-12-29: 125 mg via INTRAVENOUS

## 2020-12-29 MED ORDER — LIDOCAINE 2% (20 MG/ML) 5 ML SYRINGE
INTRAMUSCULAR | Status: DC | PRN
  Administered 2020-12-29: 100 mg via INTRAVENOUS

## 2020-12-29 MED ORDER — PROPOFOL 10 MG/ML IV BOLUS
INTRAVENOUS | Status: AC
  Filled 2020-12-29: qty 20

## 2020-12-29 MED ORDER — POVIDONE-IODINE 10 % EX SWAB
2.0000 "application " | Freq: Once | CUTANEOUS | Status: AC
  Administered 2020-12-29: 2 via TOPICAL

## 2020-12-29 SURGICAL SUPPLY — 57 items
APPLICATOR ARISTA FLEXITIP XL (MISCELLANEOUS) ×2 IMPLANT
BARRIER ADHS 3X4 INTERCEED (GAUZE/BANDAGES/DRESSINGS) ×2 IMPLANT
CABLE HIGH FREQUENCY MONO STRZ (ELECTRODE) ×2 IMPLANT
COVER MAYO STAND STRL (DRAPES) ×4 IMPLANT
DERMABOND ADVANCED (GAUZE/BANDAGES/DRESSINGS) ×2
DERMABOND ADVANCED .7 DNX12 (GAUZE/BANDAGES/DRESSINGS) ×2 IMPLANT
DISSECTOR BLUNT TIP ENDO 5MM (MISCELLANEOUS) ×2 IMPLANT
DRAPE POUCH INSTRU U-SHP 10X18 (DRAPES) ×2 IMPLANT
DURAPREP 26ML APPLICATOR (WOUND CARE) ×4 IMPLANT
FILTER SMOKE EVAC LAPAROSHD (FILTER) ×2 IMPLANT
GLOVE SURG ENC MOIS LTX SZ7 (GLOVE) ×4 IMPLANT
GLOVE SURG UNDER POLY LF SZ7 (GLOVE) ×16 IMPLANT
GOWN STRL REUS W/ TWL LRG LVL3 (GOWN DISPOSABLE) ×6 IMPLANT
GOWN STRL REUS W/TWL LRG LVL3 (GOWN DISPOSABLE) ×12
HEMOSTAT ARISTA ABSORB 3G PWDR (HEMOSTASIS) ×2 IMPLANT
HIBICLENS CHG 4% 4OZ BTL (MISCELLANEOUS) ×4 IMPLANT
IRRIGATION STRYKERFLOW (MISCELLANEOUS) ×2 IMPLANT
IRRIGATOR STRYKERFLOW (MISCELLANEOUS) ×4
KIT TURNOVER KIT B (KITS) ×4 IMPLANT
LIGASURE VESSEL 5MM BLUNT TIP (ELECTROSURGICAL) ×4 IMPLANT
MANIPULATOR ADVINCU DEL 2.5 PL (MISCELLANEOUS) ×4 IMPLANT
MANIPULATOR ADVINCU DEL 3.0 PL (MISCELLANEOUS) IMPLANT
MANIPULATOR ADVINCU DEL 3.5 PL (MISCELLANEOUS) IMPLANT
MANIPULATOR ADVINCU DEL 4.0 PL (MISCELLANEOUS) IMPLANT
NS IRRIG 1000ML POUR BTL (IV SOLUTION) ×4 IMPLANT
PACK LAPAROSCOPY BASIN (CUSTOM PROCEDURE TRAY) ×4 IMPLANT
PACK TRENDGUARD 450 HYBRID PRO (MISCELLANEOUS) IMPLANT
PROTECTOR NERVE ULNAR (MISCELLANEOUS) ×8 IMPLANT
SCISSORS LAP 5X35 DISP (ENDOMECHANICALS) ×4 IMPLANT
SET CYSTO W/LG BORE CLAMP LF (SET/KITS/TRAYS/PACK) ×2 IMPLANT
SET TRI-LUMEN FLTR TB AIRSEAL (TUBING) ×2 IMPLANT
SET TUBE SMOKE EVAC HIGH FLOW (TUBING) ×4 IMPLANT
SLEEVE ENDOPATH XCEL 5M (ENDOMECHANICALS) ×4 IMPLANT
SOL ANTI FOG 6CC (MISCELLANEOUS) IMPLANT
SOLUTION ANTI FOG 6CC (MISCELLANEOUS) ×2
SUT DVC VLOC 180 0 12IN GS21 (SUTURE) ×8
SUT MNCRL AB 4-0 PS2 18 (SUTURE) ×8 IMPLANT
SUT VIC AB 0 CT1 18XCR BRD8 (SUTURE) IMPLANT
SUT VIC AB 0 CT1 27 (SUTURE) ×4
SUT VIC AB 0 CT1 27XBRD ANBCTR (SUTURE) IMPLANT
SUT VIC AB 0 CT1 8-18 (SUTURE)
SUT VIC AB 2-0 CT1 (SUTURE) IMPLANT
SUT VICRYL 0 UR6 27IN ABS (SUTURE) ×4 IMPLANT
SUT VLOC 180 0 9IN  GS21 (SUTURE)
SUT VLOC 180 0 9IN GS21 (SUTURE) IMPLANT
SUTURE DVC VLC 180 0 12IN GS21 (SUTURE) IMPLANT
SYR 10ML LL (SYRINGE) ×2 IMPLANT
SYR 50ML LL SCALE MARK (SYRINGE) IMPLANT
SYR BULB IRRIG 60ML STRL (SYRINGE) ×4 IMPLANT
TOWEL GREEN STERILE FF (TOWEL DISPOSABLE) ×12 IMPLANT
TRAY FOLEY W/BAG SLVR 14FR (SET/KITS/TRAYS/PACK) ×4 IMPLANT
TRENDGUARD 450 HYBRID PRO PACK (MISCELLANEOUS) ×4
TROCAR PORT AIRSEAL 8X120 (TROCAR) ×2 IMPLANT
TROCAR XCEL NON-BLD 11X100MML (ENDOMECHANICALS) ×4 IMPLANT
TROCAR XCEL NON-BLD 5MMX100MML (ENDOMECHANICALS) ×4 IMPLANT
UNDERPAD 30X36 HEAVY ABSORB (UNDERPADS AND DIAPERS) ×4 IMPLANT
WARMER LAPAROSCOPE (MISCELLANEOUS) ×4 IMPLANT

## 2020-12-29 NOTE — H&P (Signed)
MD GYN HISTORY AND PHYSICAL  Admission Date: 12/29/2020  7:08 AM  Admit Diagnosis: Abnormal uterine bleeding Patient Name: Patricia Blevins        MRN#: [id number]  Subjective:    Patient is a 35 y.o. adult No obstetric history on file. presents for scheduled Total laparoscopic hysterectomy.  The indications for procedure are abnormal uterine bleeding.   Pertinent Gynecological History: Menses:flow is excessive with use of 6 pads or tampons on heaviest days and with severe dysmenorrhea  Bleeding: dysfunctional uterine bleeding Contraception: none DES exposure: denies Blood transfusions: none Sexually transmitted diseases: no past history Previous GYN Procedures:  none   Last mammogram:  n/a   Last pap: normal Date: 09/11/20 OB History: G0, P0   Menstrual History: Menarche age: 23  Medical / Surgical History: Past medical history:  Past Medical History:  Diagnosis Date   Anxiety    Asthma    Depression    Dermatomyositis ([place])    Melanoma ([place])    Pneumonia    Substance abuse ([place])    No drink since October    Past surgical history:  Past Surgical History:  Procedure Laterality Date   lymph node removal     melanoma removal  2008-2009   SKIN BIOPSY  2021   Between breast for psoriasis   THIGH / KNEE SOFT TISSUE BIOPSY Right 2000   Family History:  Family History  Problem Relation Age of Onset   Arthritis Mother    Hypertension Mother    Arthritis Father    Hypertension Brother    Arthritis Maternal Grandmother    Stroke Maternal Grandmother    Arthritis Maternal Grandfather    Melanoma Maternal Grandfather    Cancer Paternal Grandmother        breast   Diabetes Paternal Grandmother     Social History:  reports that [employer] has never smoked. [address] [place] has never used smokeless tobacco. Angles Trevizo reports that [employer] does not currently use alcohol. [address] [place] reports that [employer] does not use drugs.  Allergies: Allergies   Allergen Reactions   Zofran [Ondansetron]     Ineffective, pt prefers promethazine      Current Medications at time of admission:  Prior to Admission medications   Medication Sig Start Date End Date Taking? Authorizing Provider  acetaminophen (TYLENOL) 325 MG tablet Take 650 mg by mouth every 6 (six) hours as needed for moderate pain.   Yes [provider]  calcium carbonate (TUMS - DOSED IN MG ELEMENTAL CALCIUM) 500 MG chewable tablet Chew 2,000 mg by mouth daily as needed for indigestion or heartburn.   Yes [provider]  Calcium Carbonate-Vit D-Min (CALCIUM 600+D3 PLUS MINERALS) 600-800 MG-UNIT TABS Take 2 tablets by mouth 2 (two) times daily.   Yes [provider]  clonazePAM (KLONOPIN) 0.5 MG tablet Take 0.5 mg by mouth 2 (two) times daily as needed for anxiety.   Yes [provider]  gabapentin (NEURONTIN) 100 MG capsule Take 300 mg by mouth at bedtime.   Yes [provider]  Lactobacillus (PROBIOTIC ACIDOPHILUS) CAPS Take 1 capsule by mouth daily. Patient taking differently: Take 2 capsules by mouth daily. 01/22/16  Yes Burns, Claudina Lick, MD  Multiple Vitamin (MULTIVITAMIN WITH MINERALS) TABS tablet Take 2 tablets by mouth daily.   Yes [provider]  mycophenolate (CELLCEPT) 500 MG tablet Take 1,000 mg by mouth 2 (two) times daily.   Yes [provider]  predniSONE (DELTASONE) 5 MG tablet Take  10 mg by mouth daily.   Yes [provider]  promethazine (PHENERGAN) 12.5 MG tablet Take 12.5 mg by mouth every 8 (eight) hours as needed for nausea/vomiting. 12/10/20  Yes [provider]  riTUXimab (RITUXAN IV) Inject 1 Dose into the vein See admin instructions. Yearly   Yes [provider]  valACYclovir (VALTREX) 500 MG tablet Take 1,000 mg by mouth daily.   Yes [provider]  ibuprofen (ADVIL) 200 MG tablet Take 400 mg by mouth every 6 (six) hours as needed for moderate pain or headache.     [provider]  fluticasone (FLONASE) 50 MCG/ACT nasal spray Place 2 sprays into both nostrils daily. Patient not taking: Reported on 10/13/2016 05/18/16 09/20/18  Forrest Moron, MD  norethindrone (ORTHO MICRONOR) 0.35 MG tablet Take 1 tablet (0.35 mg total) by mouth daily. Patient not taking: Reported on 05/18/2016 02/25/16 09/20/18  Forrest Moron, MD    Review of Systems: Constitutional: Negative   HENT: Negative   Eyes: Negative   Respiratory: Negative   Cardiovascular: Negative   Gastrointestinal: Negative  Genitourinary: positive for vaginal bleeding   Musculoskeletal: Negative   Skin: Negative   Neurological: Negative   Endo/Heme/Allergies: Negative   Psychiatric/Behavioral: Negative      Objective:     Physical Exam: VS: Blood pressure (!) 159/94, pulse 77, temperature 98.1 F (36.7 C), temperature source Oral, resp. rate 18, height 5\' 7"  (1.702 m), weight 95.3 kg, last menstrual period 12/24/2020, SpO2 98 %. Physical Exam General:   alert, cooperative, and no distress  Skin:   normal  Lungs:   clear to auscultation bilaterally  Heart:   regular rate and rhythm, S1, S2 normal, no murmur, click, rub or gallop  Abdomen:  soft, non-tender; bowel sounds normal; no masses,  no organomegaly  Pelvis:  Exam deferred.   Labs / Imaging: Results for orders placed or performed during the hospital encounter of 12/29/20 (from the past 24 hour(s))  Pregnancy, urine POC     Status: None   Collection Time: 12/29/20  8:04 AM  Result Value Ref Range   Preg Test, Ur NEGATIVE NEGATIVE  ABO/Rh     Status: None   Collection Time: 12/29/20  8:20 AM  Result Value Ref Range   ABO/RH(D)      A NEG Performed at [facility] [address], [place], [place] [address]    No results found.    Assessment:        Patient is a 35 y.o. y.o No obstetric history on file. with diagnosis of  abnormal uterine bleeding and dysmenorrhea        Plan:    Patient on call to OR for  TLH / BS/ cystoscopy Ancef on call to OR   I had a lengthy discussion with the patient regarding her diagnosis. She was counseled about the procedure, risks, reasons, benefits and complications.The patient was counseled as to indications, alternatives, risks and benefits of planned procedure.  Risk include, but are not limited to infection, blood clots in the veins and lungs (pulmonary embolus), allergic reactions, hemorrhage requiring possible transfusion, and even death.  The patient understands that this procedure will cause permanent sterility (inability to become pregnant). Possible need for open incision (laparotomy) to complete the procedure or in event of injury. There is a possibility of injury to the genital tract, bowel-requiring a colostomy for repair, bladder requiring prolonged catheterization, ureters requiring stent placement, or other pelvic/abdominal organs, the vessels or  nerves.  Possibility of intestinal obstruction.  Possible need for further medical or surgical therapy as indicated.  Possible wound breakdown.  Possibility of non-cure of symptoms or return of symptoms at a later date.  Possible hernia formation at the sites of abdominal surgical wounds.  Possible less than satisfactory healing of scars (unsightly/tender).  Remote possibility of urinary and/or fecal incontinence (uncontrolled urine or feces leakage due to fistula formation from bladder and/or bowel to vagina) after the procedure.  Possible diagnosis of malignancy with the need for further medical and/or surgical therapy.  Possible need for life-long hormonal replacement therapy. The patient understands the procedure and its attendant risks, and gives informed consent to proceed.  Consent signed, witnessed and placed into chart. The patient was given the opportunity to ask questions, and her concerns were adequately addressed.     Sanjuana Kava MD 12/29/2020, 9:15 AM

## 2020-12-29 NOTE — Progress Notes (Signed)
Patient transferred to 1S03, receiving RN at bedside. No questions or concerns from [facility], RN. Called and spoke with patients father and updated them on patients location and status.  Rowe Pavy, RN

## 2020-12-29 NOTE — Anesthesia Procedure Notes (Signed)
Procedure Name: Intubation Date/Time: 12/29/2020 9:32 AM Performed by: Glynda Jaeger, CRNA Pre-anesthesia Checklist: Patient identified, Patient being monitored, Timeout performed, Emergency Drugs available and Suction available Patient Re-evaluated:Patient Re-evaluated prior to induction Oxygen Delivery Method: Circle System Utilized Preoxygenation: Pre-oxygenation with 100% oxygen Induction Type: IV induction Ventilation: Mask ventilation without difficulty Laryngoscope Size: Mac and 4 Grade View: Grade I Tube type: Oral Tube size: 7.0 mm Number of attempts: 1 Airway Equipment and Method: Stylet Placement Confirmation: ETT inserted through vocal cords under direct vision, positive ETCO2 and breath sounds checked- equal and bilateral Secured at: 22 cm Tube secured with: Tape Dental Injury: Teeth and Oropharynx as per pre-operative assessment

## 2020-12-29 NOTE — Op Note (Signed)
OPERATIVE NOTE  Patricia Blevins  DOB:    02-14-1985  MRN:    [id number]  CSN:    [id number]  Date of Surgery:  12/29/2020  Preoperative Diagnosis: Abnormal uterine bleeding Dysmenorrhea  Postoperative Diagnosis: same  Procedure: Total Laparoscopic hysterectomy Bilateral salpingectomy Cystoscopy  Surgeon: Mady Haagensen. Alwyn Pea, M.D.  Assistant: Crawford Givens, M.D.  Anesthetic: General  ETA  EBL: 150 cc  IVF:  300 mL LR  UOP:  603 mL Clear urine ( Foley)  Specimen:  Uterus, cervix, bilateral tubes  Complications:  None  Disposition: The patient presents with the above-mentioned diagnosis. She understands the indications for surgical procedure.  She also understands the alternative treatment options. She accepts the risk of, but not limited to, anesthetic complications, bleeding, infections, and possible damage to the surrounding organs.  Indication: 35 year old with long standing history of abnormal uterine bleeding and dysmenorrhea. She has no desire for fertility and having children in the future. Patient counseled regarding alternative options, however she desires definitive surgery.   Findings: Exam under anesthesia: External vulva: normal vaginal vault: normal cervix: grossly normal nulliparous. Uterus: 10 weeks size, mobile anteverted, no palpable masses, adnexa: no palpable masses.  Laparoscopy: normal appearing pelvis including normal uterus, bilateral fallopian tubes and ovaries, possible old endometriosis scar in the posterior cul de sac.  Normal appearing appendix, liver edge and gallbladder. Cystoscopy: Normal bladder urothelium without injury. Brisk ureteral jets bilaterally after procedure.   Procedure: The patient was brought into the operating room with an intravenous line in placed, and anesthetic was administered. She was placed in a low dorsal lithotomy position using Allen stirrups. The patient's  abdomen was prepped with ChloraPrep. The perineum and vagina  were prepped with multiple layers of Betadine the bladder catheterized with a foley.    A Time out was performed confirming the patient name and procedure. An weight speculum was placed in the vagina and a Deaver retractor anteriorly. The anterior lip of the cervix was held with a single tooth tenaculum. The uterus was sounded to 9cm  and dilated with [facility] dilators. The tenaculum was removed and the anterior lip of the cervix held with a stitch of 0-vicryl. 2.5 cm Advincula uterine manipulator was placed and the intrauterine balloon inflated. The  Speculum was removed. Surgeons gloves and gown were changed and attention turned to the abdomen.   The patient was sterilely draped. The subumbilical area was injected with half percent Marcaine.  An incision was made in the umbilicus with 11 blade scalpel after infiltration with 0.5% Marcaine. A 71mm Excel Visiport trocar was used with l4mm 0 degree laparoscope attached to perform direct entry into the patient's abdomen. Direct video visualization confirmed entry into the abdomen and pneumoperitoneum was obtained using approximately 3L CO2 gas. The patient was placed in steep Trendelenburg position. After infiltration with Marcaine, a small incision was made and a 5 mm trocar was inserted into the abdominal cavity along the left pelvis under direct visualization.  Another 83mm  trocar was placed along the mid upper left abdomen. A final 67mm trocar was placed along the  right pelvis. There was no injury noted with placement of trocars. The pelvic contents were visualized and findings noted above, both ureters were identified crossing the pelvic brim and pelvic sidewall coursing well below operative field.  Pictures were taken of the patient's pelvic structures.    The left fallopian tube was identified and followed to its fimbriated end. The proximal portion of the left fallopian tube  was cauterized and cut. The left round ligament was cauterized and transected. The  left utero-ovarian ligament was cauterized and cut. The bladder flap was developed by entering the anterior broad ligament.  The right fallopian tube was identified and followed to its fimbriated end. The proximal portion of the right fallopian tube was cauterized and cut. The right upper pedicle was then isolated. The right round ligament was then cauterized and transected via the Ligasure. The anterior broad ligament was then isolated and the the mesovarium was skeletonized. A bladder flap was mobilized by the endoshears attached to monopolar cautery and the peritoneum on the vesicouterine fold was incised to mobilize the bladder.The right utero-ovarian ligament was cauterized and cut. The bladder flap was developed further. Once the [facility] colpotomy ring was skeletonized and in position, the uterine arteries were sealed and transected using the 58mm Ligasure at the level of the colpotomy ring.  The uterine arteries were coagulated and transected bilaterally using the 5 mm Ligasure.   The vagina was transected using the monopolar endoshears resulting in separation of the uterus and attached tubes. The uterus, cervix and tubes were then delivered through the vagina. The vaginal occluder was insufflated with 60 cc of water and the vaginal vault was laparoscopically with 0-v-loc suture.  A modified colposuspension was performed by incorporating the uterosacral ligaments at the angles of the vaginal cuff.  The surgical technician confirmed that the vault was closed by placing a finger in the vagina and there were no notable defects. Irrigation was performed and the vaginal cuff and the pedicles were noted to be hemostatic.      A cystoscopy was then performed using a 70 degree cystoscopy and a complete bladder survey was performed. There was no noted injury to the bladder urothelium and there were brisk jets from both ureteral orifices.    The accessory ports were removed under direct laparoscopic guidance and  the  pneumoperitoneum was released. The umbilical port was removed using laparoscopic guidance. The umbilical fascia was closed with an interrupted figure-of-eight stitch using 0 Vicryl on a UR-6. The skin was closed with subcuticular stitches using 4-0 Monocryl suture. Dermabond was used to cover each incision site and op sites placed.  The final sponge, needle, and instrument counts were correct at the completion of the procedure. The patient tolerated the procedure well and was awakened and taken to the post anesthesia care unit in stable condition.     Sanjuana Kava M.D.  Attending Attestation: I PERFORMED THE PROCEDURE AND MY ASSISTANT WAS NEEDED FOR THE COMPLEXITY OF THE CASE

## 2020-12-29 NOTE — Transfer of Care (Signed)
Immediate Anesthesia Transfer of Care Note  Patient: [employer]  Procedure(s) Performed: TOTAL LAPAROSCOPIC HYSTERECTOMY WITH BILATERAL SALPINGECTOMY (Abdomen) CYSTOSCOPY (Bladder)  Patient Location: PACU  Anesthesia Type:General  Level of Consciousness: awake, alert , patient cooperative and responds to stimulation  Airway & Oxygen Therapy: Patient Spontanous Breathing and Patient connected to face mask oxygen  Post-op Assessment: Report given to RN and Post -op Vital signs reviewed and stable  Post vital signs: Reviewed and stable  Last Vitals:  Vitals Value Taken Time  BP 119/79 12/29/20 1245  Temp    Pulse 77 12/29/20 1247  Resp 14 12/29/20 1247  SpO2 100 % 12/29/20 1247  Vitals shown include unvalidated device data.  Last Pain:  Vitals:   12/29/20 0819  TempSrc:   PainSc: 7       Patients Stated Pain Goal: 0 (01/58/68 2574)  Complications: No notable events documented.

## 2020-12-29 NOTE — Plan of Care (Signed)

## 2020-12-30 ENCOUNTER — Encounter (HOSPITAL_COMMUNITY): Payer: Self-pay | Admitting: Obstetrics & Gynecology

## 2020-12-30 DIAGNOSIS — N938 Other specified abnormal uterine and vaginal bleeding: Secondary | ICD-10-CM | POA: Diagnosis not present

## 2020-12-30 LAB — CBC
HCT: 30.4 % — ABNORMAL LOW (ref 36.0–46.0)
Hemoglobin: 9.8 g/dL — ABNORMAL LOW (ref 12.0–15.0)
MCH: 31.1 pg (ref 26.0–34.0)
MCHC: 32.2 g/dL (ref 30.0–36.0)
MCV: 96.5 fL (ref 80.0–100.0)
Platelets: 252 10*3/uL (ref 150–400)
RBC: 3.15 MIL/uL — ABNORMAL LOW (ref 3.87–5.11)
RDW: 13.2 % (ref 11.5–15.5)
WBC: 11.7 10*3/uL — ABNORMAL HIGH (ref 4.0–10.5)
nRBC: 0 % (ref 0.0–0.2)

## 2020-12-30 LAB — SURGICAL PATHOLOGY

## 2020-12-30 MED ORDER — IBUPROFEN 600 MG PO TABS: 600.0000 mg | ORAL_TABLET | Freq: Four times a day (QID) | ORAL | 2 refills | Status: AC | PRN

## 2020-12-30 MED ORDER — OXYCODONE-ACETAMINOPHEN 5-325 MG PO TABS: 1.0000 | ORAL_TABLET | ORAL | 0 refills | Status: DC | PRN

## 2020-12-30 MED ORDER — SIMETHICONE 80 MG PO CHEW
80.0000 mg | CHEWABLE_TABLET | Freq: Four times a day (QID) | ORAL | 0 refills | Status: DC
Start: 2020-12-30 — End: 2022-05-10

## 2020-12-30 NOTE — Progress Notes (Signed)
Pt ambulated at discharge via cane in stable condition. Pt sent home with all belongings and discharge paperwork. Pt verbalized understanding and all questions were answered.

## 2020-12-31 NOTE — Anesthesia Postprocedure Evaluation (Signed)
Anesthesia Post Note  Patient: Patricia Blevins  Procedure(s) Performed: TOTAL LAPAROSCOPIC HYSTERECTOMY WITH BILATERAL SALPINGECTOMY (Abdomen) CYSTOSCOPY (Bladder)     Patient location during evaluation: PACU Anesthesia Type: General Level of consciousness: awake and alert Pain management: pain level controlled Vital Signs Assessment: post-procedure vital signs reviewed and stable Respiratory status: spontaneous breathing, nonlabored ventilation, respiratory function stable and patient connected to nasal cannula oxygen Cardiovascular status: blood pressure returned to baseline and stable Postop Assessment: no apparent nausea or vomiting Anesthetic complications: no   No notable events documented.  Last Vitals:  Vitals:   12/30/20 0520 12/30/20 0727  BP: 114/65 129/70  Pulse: 62 68  Resp: 16 16  Temp: 36.7 C 36.7 C  SpO2: 98% 100%    Last Pain:  Vitals:   12/30/20 0727  TempSrc: Oral  PainSc:    Pain Goal: Patients Stated Pain Goal: 0 (12/29/20 0819)                 Deatrice Spanbauer S

## 2021-07-06 ENCOUNTER — Ambulatory Visit
Admission: RE | Admit: 2021-07-06 | Discharge: 2021-07-06 | Disposition: A | Discharge status: Home / Self Care | Payer: BC Managed Care – PPO | Source: Ambulatory Visit | Attending: Family Medicine | Admitting: Family Medicine

## 2021-07-06 ENCOUNTER — Other Ambulatory Visit: Payer: Self-pay | Admitting: Family Medicine

## 2021-07-06 DIAGNOSIS — M25511 Pain in right shoulder: Secondary | ICD-10-CM

## 2022-02-26 ENCOUNTER — Encounter: Payer: Self-pay | Admitting: Nurse Practitioner

## 2022-03-30 ENCOUNTER — Ambulatory Visit: Payer: BC Managed Care – PPO | Admitting: Nurse Practitioner

## 2022-03-30 NOTE — Progress Notes (Deleted)
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 Darron Doom for diarrhea   Patricia Blevins is a 37 y.o. year old adult with a past medical history of polymyositis. See PMH / [place] for additional history.      Previous GI history:         Previous Labs / Imaging::    Latest Ref Rng & Units 12/30/2020    5:10 AM 12/25/2020    3:09 PM 01/22/2016   10:18 AM  CBC  WBC 4.0 - 10.5 K/uL 11.7  12.6  14.2   Hemoglobin 12.0 - 15.0 g/dL 9.8  13.2  13.2   Hematocrit 36.0 - 46.0 % 30.4  40.7  38.5   Platelets 150 - 400 K/uL 252  292  295.0     No results found for: "LIPASE"    Latest Ref Rng & Units 12/25/2020    3:09 PM 01/22/2016   10:18 AM  CMP  Glucose 70 - 99 mg/dL 106  86   BUN 6 - 20 mg/dL 8  10   Creatinine 0.44 - 1.00 mg/dL 0.49  0.43   Sodium 135 - 145 mmol/L 139  140   Potassium 3.5 - 5.1 mmol/L 3.5  3.5   Chloride 98 - 111 mmol/L 106  103   CO2 22 - 32 mmol/L 25  29   Calcium 8.9 - 10.3 mg/dL 9.0  9.7   Total Protein 6.0 - 8.3 g/dL  7.2   Total Bilirubin 0.2 - 1.2 mg/dL  0.5   Alkaline Phos 39 - 117 U/L  33   AST 0 - 37 U/L  19   ALT 0 - 35 U/L  22           Imaging:  DG Shoulder Right CLINICAL DATA:  Increased shoulder pain  EXAM: RIGHT SHOULDER - 2+ VIEW  COMPARISON:  None Available.  FINDINGS: No fracture or malalignment. AC joint is intact. There are right axillary clips.  IMPRESSION: Negative.  Electronically Signed   By: Donavan Foil M.D.   On: 07/06/2021 23:52    Past Medical History:  Diagnosis Date   Anxiety    Asthma    Depression    Dermatomyositis ([place])    Melanoma ([place])    Pneumonia    Substance abuse ([place])    No drink since October   Past Surgical History:  Procedure Laterality Date   CYSTOSCOPY N/A 12/29/2020   Procedure: CYSTOSCOPY;  Surgeon: Sanjuana Kava,  MD;  Location: [facility];  Service: Gynecology;  Laterality: N/A;   lymph node removal     melanoma removal  2008-2009   SKIN BIOPSY  2021   Between breast for psoriasis   THIGH / KNEE SOFT TISSUE BIOPSY Right 2000   TOTAL LAPAROSCOPIC HYSTERECTOMY WITH SALPINGECTOMY N/A 12/29/2020   Procedure: TOTAL LAPAROSCOPIC HYSTERECTOMY WITH BILATERAL SALPINGECTOMY;  Surgeon: Sanjuana Kava, MD;  Location: [facility];  Service: Gynecology;  Laterality: N/A;   Family History  Problem Relation Age of Onset   Arthritis Mother    Hypertension Mother    Arthritis Father    Hypertension Brother    Arthritis Maternal Grandmother    Stroke Maternal Grandmother    Arthritis Maternal Grandfather    Melanoma Maternal Grandfather  Cancer Paternal Grandmother        breast   Diabetes Paternal Grandmother    Social History   Tobacco Use   Smoking status: Never   Smokeless tobacco: Never  Vaping Use   Vaping Use: Never used  Substance Use Topics   Alcohol use: Not Currently   Drug use: No    Comment: in 20's used marijuana and cocaine   Current Outpatient Medications  Medication Sig Dispense Refill   acetaminophen (TYLENOL) 325 MG tablet Take 650 mg by mouth every 6 (six) hours as needed for moderate pain.     calcium carbonate (TUMS - DOSED IN MG ELEMENTAL CALCIUM) 500 MG chewable tablet Chew 2,000 mg by mouth daily as needed for indigestion or heartburn.     Calcium Carbonate-Vit D-Min (CALCIUM 600+D3 PLUS MINERALS) 600-800 MG-UNIT TABS Take 2 tablets by mouth 2 (two) times daily.     clonazePAM (KLONOPIN) 0.5 MG tablet Take 0.5 mg by mouth 2 (two) times daily as needed for anxiety.     gabapentin (NEURONTIN) 100 MG capsule Take 300 mg by mouth at bedtime.     ibuprofen (ADVIL) 600 MG tablet Take 1 tablet (600 mg total) by mouth every 6 (six) hours as needed for cramping or moderate pain. 60 tablet 2   Lactobacillus (PROBIOTIC ACIDOPHILUS) CAPS Take 1 capsule by mouth daily. (Patient taking differently:  Take 2 capsules by mouth daily.)     Multiple Vitamin (MULTIVITAMIN WITH MINERALS) TABS tablet Take 2 tablets by mouth daily.     mycophenolate (CELLCEPT) 500 MG tablet Take 1,000 mg by mouth 2 (two) times daily.     oxyCODONE-acetaminophen (PERCOCET/ROXICET) 5-325 MG tablet Take 1-2 tablets by mouth every 4 (four) hours as needed for severe pain. 40 tablet 0   predniSONE (DELTASONE) 5 MG tablet Take 10 mg by mouth daily.     promethazine (PHENERGAN) 12.5 MG tablet Take 12.5 mg by mouth every 8 (eight) hours as needed for nausea/vomiting.     riTUXimab (RITUXAN IV) Inject 1 Dose into the vein See admin instructions. Yearly     simethicone (MYLICON) 80 MG chewable tablet Chew 1 tablet (80 mg total) by mouth 4 (four) times daily. 30 tablet 0   valACYclovir (VALTREX) 500 MG tablet Take 1,000 mg by mouth daily.     No current facility-administered medications for this visit.   Allergies  Allergen Reactions   Zofran [Ondansetron]     Ineffective, pt prefers promethazine      Review of Systems: Positive for ***.  All other systems reviewed and negative except where noted in HPI.   Wt Readings from Last 3 Encounters:  12/29/20 210 lb (95.3 kg)  12/25/20 207 lb (93.9 kg)  01/09/19 206 lb 9.6 oz (93.7 kg)    Physical Exam:  There were no vitals taken for this visit. Constitutional:  Pleasant, generally well appearing ***female in no acute distress. Psychiatric:  Normal mood and affect. Behavior is normal. EENT: Pupils normal.  Conjunctivae are normal. No scleral icterus. Neck supple.  Cardiovascular: Normal rate, regular rhythm.  Pulmonary/chest: Effort normal and breath sounds normal. No wheezing, rales or rhonchi. Abdominal: Soft, nondistended, nontender. Bowel sounds active throughout. There are no masses palpable. No hepatomegaly. Neurological: Alert and oriented to person place and time. Extremities: *** No edema Skin: Skin is warm and dry. No rashes noted.  Tye Savoy, NP   03/30/2022, 11:06 AM  Cc:  Referring Provider Patricia Rasmussen, MD

## 2022-05-07 ENCOUNTER — Other Ambulatory Visit: Payer: Self-pay | Admitting: Family Medicine

## 2022-05-07 ENCOUNTER — Other Ambulatory Visit (HOSPITAL_COMMUNITY): Payer: Self-pay | Admitting: Family Medicine

## 2022-05-07 ENCOUNTER — Ambulatory Visit
Admission: RE | Admit: 2022-05-07 | Discharge: 2022-05-07 | Disposition: A | Discharge status: Home / Self Care | Payer: BC Managed Care – PPO | Source: Ambulatory Visit | Attending: Family Medicine | Admitting: Family Medicine

## 2022-05-07 DIAGNOSIS — R0609 Other forms of dyspnea: Secondary | ICD-10-CM

## 2022-05-07 DIAGNOSIS — U099 Post covid-19 condition, unspecified: Secondary | ICD-10-CM

## 2022-05-09 NOTE — Progress Notes (Signed)
Cardiology Office Note:   Date:  05/10/2022  NAME:  Patricia Blevins    MRN: [id number] DOB:  [birth year]   PCP:  Dois Davenport, MD  Cardiologist:  None  Electrophysiologist:  None   Referring MD: Dois Davenport, MD   Chief Complaint  Patient presents with   Shortness of Breath   History of Present Illness:   Patricia Blevins is a 37 y.o. adult with a hx of dermatopolysitis who is being seen today for the evaluation of shortness of breath at the request of Dois Davenport, MD. Patricia Blevins reports a longstanding history of dermatomyositis.  Patricia Blevins follows with a neurologist at [facility].  Treatment is Rituxan.  Patricia Blevins seems to be quite debilitated from this with difficult T with activity.  Patricia Blevins does use a walker.  Apparently things have been stable.  COVID-19 infection reported in December.  Symptoms included upper respiratory infection as well as chest congestion.  As well as shortness of breath.  Also reports of dizziness and lightheadedness upon standing as well as rapid heartbeat sensation.  Symptoms are every day and worsening.  There are several syncopal episodes described.  They can occur with blurry vision and change in position.  No chest discomfort but does report shortness of breath.  No seizure-like activity is reported.  No history of smoking or alcohol abuse.  Family history is significant for heart disease and paternal grandfather.  Chest x-ray obtained on Friday by primary care physician with possible pneumonia.  CV exam normal.  Not currently working.  Not married.  No children.  EKG with sinus rhythm and nonspecific ST-T changes.  Left axis deviation noted.  Symptoms are occurring every day.  They are ongoing.  Not getting better.  Patricia Blevins is drinking 40 to 60 ounces of water per day.  Reports thyroid was checked by primary care physician last week.  Hemoglobin and serum creatinine have been normal.  Echocardiogram is planned for late May.  We discussed heart monitor as well.  CXR 4/26 with  possible PNA  Covid 12/2021   HGB 12.2 Cr 0.30  Problem List Dermatomyositis  Metastatic melanoma -2009 (in remission) 3. Lymphedema   Past Medical History: Past Medical History:  Diagnosis Date   Anxiety    Asthma    Depression    Dermatomyositis ([facility])    Melanoma ([facility])    Pneumonia    Substance abuse ([facility])    No drink since October    Past Surgical History: Past Surgical History:  Procedure Laterality Date   CYSTOSCOPY N/A 12/29/2020   Procedure: CYSTOSCOPY;  Surgeon: Essie Hart, MD;  [facility];  Service: Gynecology;  Laterality: N/A;   lymph node removal     melanoma removal  2008-2009   SKIN BIOPSY  2021   Between breast for psoriasis   THIGH / KNEE SOFT TISSUE BIOPSY Right 2000   TOTAL LAPAROSCOPIC HYSTERECTOMY WITH SALPINGECTOMY N/A 12/29/2020   Procedure: TOTAL LAPAROSCOPIC HYSTERECTOMY WITH BILATERAL SALPINGECTOMY;  Surgeon: Essie Hart, MD;  Location: [facility];  Service: Gynecology;  Laterality: N/A;    Current Medications: Current Meds  Medication Sig   acetaminophen (TYLENOL) 325 MG tablet Take 650 mg by mouth every 6 (six) hours as needed for moderate pain.   calcium carbonate (TUMS - DOSED IN MG ELEMENTAL CALCIUM) 500 MG chewable tablet Chew 2,000 mg by mouth daily as needed for indigestion or heartburn.   Calcium Carbonate-Vit D-Min (CALCIUM 600+D3 PLUS MINERALS) 600-800 MG-UNIT TABS Take 2 tablets by mouth 2 (  two) times daily.   clonazePAM (KLONOPIN) 0.5 MG tablet Take 0.5 mg by mouth 2 (two) times daily as needed for anxiety.   ibuprofen (ADVIL) 600 MG tablet Take 1 tablet (600 mg total) by mouth every 6 (six) hours as needed for cramping or moderate pain.   Lactobacillus (PROBIOTIC ACIDOPHILUS) CAPS Take 1 capsule by mouth daily. (Patient taking differently: Take 2 capsules by mouth daily.)   Multiple Vitamin (MULTIVITAMIN WITH MINERALS) TABS tablet Take 2 tablets by mouth daily.   mycophenolate (CELLCEPT) 500 MG tablet Take 1,000 mg by mouth 2  (two) times daily.   predniSONE (DELTASONE) 5 MG tablet Take 10 mg by mouth daily.   riTUXimab (RITUXAN IV) Inject 1 Dose into the vein See admin instructions. Yearly   valACYclovir (VALTREX) 500 MG tablet Take 1,000 mg by mouth daily.     Allergies:    Zofran [ondansetron]   Social History: Social History   Socioeconomic History   Marital status: Single    Spouse name: Not on file   Number of children: Not on file   Years of education: Not on file   Highest education level: Not on file  Occupational History   Not on file  Tobacco Use   Smoking status: Never   Smokeless tobacco: Never  Vaping Use   Vaping Use: Never used  Substance and Sexual Activity   Alcohol use: Not Currently   Drug use: No    Comment: in 20's used marijuana and cocaine   Sexual activity: Not on file  Other Topics Concern   Not on file  Social History Narrative   Not on file   Social Determinants of Health   Financial Resource Strain: Not on file  Food Insecurity: Not on file  Transportation Needs: Not on file  Physical Activity: Not on file  Stress: Not on file  Social Connections: Not on file     Family History: The patient's family history includes Arthritis in [place] Auriemma's father, maternal grandfather, maternal grandmother, and mother; Cancer in [place] Argyle's paternal grandmother; Diabetes in [place] Marquess's paternal grandmother; Heart disease in [place] Korenek's paternal grandfather; Hypertension in [place] Schwabe's brother and mother; Melanoma in [place] Bolinger's maternal grandfather; Stroke in [place] Antunes's maternal grandmother.  ROS:   All other ROS reviewed and negative. Pertinent positives noted in the HPI.     EKGs/Labs/Other Studies Reviewed:   The following studies were personally reviewed by me today:  EKG:  EKG is ordered today.  The ekg ordered today demonstrates normal sinus rhythm heart rate 96, left axis deviation, and was personally reviewed by me.   Recent  Labs: No results found for requested labs within last 365 days.   Recent Lipid Panel    Component Value Date/Time   CHOL 184 01/22/2016 1018   TRIG 60.0 01/22/2016 1018   HDL 90.80 01/22/2016 1018   CHOLHDL 2 01/22/2016 1018   VLDL 12.0 01/22/2016 1018   LDLCALC 82 01/22/2016 1018    Physical Exam:   VS:  BP 116/80   Pulse 96   Ht 5\' 7"  (1.702 m)   Wt 195 lb 3.2 oz (88.5 kg)   SpO2 97%   BMI 30.57 kg/m    Wt Readings from Last 3 Encounters:  05/10/22 195 lb 3.2 oz (88.5 kg)  12/29/20 210 lb (95.3 kg)  12/25/20 207 lb (93.9 kg)    General: Well nourished, well developed, in no acute distress Head: Atraumatic, normal size  Eyes: PEERLA, EOMI  Neck: Supple,  no JVD Endocrine: No thryomegaly Cardiac: Normal S1, S2; RRR; no murmurs, rubs, or gallops Lungs: Clear to auscultation bilaterally, no wheezing, rhonchi or rales  Abd: Soft, nontender, no hepatomegaly  Ext: No edema, pulses 2+ Musculoskeletal: No deformities, BUE and BLE strength normal and equal Skin: Warm and dry, no rashes   Neuro: Alert and oriented to person, place, time, and situation, CNII-XII grossly intact, no focal deficits  Psych: Normal mood and affect   ASSESSMENT:   Arleta Apollo is a 37 y.o. adult who presents for the following: 1. SOB (shortness of breath) on exertion   2. Palpitations   3. Syncope and collapse     PLAN:   1. SOB (shortness of breath) on exertion 2. Palpitations 3. Syncope and collapse -Constellation of symptoms including shortness of breath with activity, dizziness, lightheadedness and tachycardia with change in position after COVID action.  Chest x-ray with possible pneumonia.  I will defer to primary care physician to treat this.  Suspect this will be prudent given her autoimmune status.  Echocardiogram is planned.  We discussed that cardiac involvement in untreated myositis is quite rare.  Pericardial involvement can be common but again this can occur with untreated disease.   Suspect low suspicion for involvement related to autoimmune disorder given well treated status. EKG with nonspecific ST changes likely related to the fact that the patient cannot lay flat for an EKG.  CV examination is normal.  We will proceed with a 7-day ZIO to exclude arrhythmia.  Symptoms sound POTS like.  This is common after viral illness.  We discussed increasing water intake as well as electrolyte supplementation.  Layan will pursue this.  Echo and monitor will exclude any cardiac pathology.  We discussed physical therapy as well.  I believe this will help.  We also discussed that driving is currently restricted.  Suspect these episodes are vasovagal.  Tammara will need to go at least 1 month without an episode before driving can be resumed apparently with current status and condition unable to drive regardless.  Dad will see [place] back in 4 months to discuss further.   Disposition: Return in about 4 months (around 09/09/2022).  Medication Adjustments/Labs and Tests Ordered: Current medicines are reviewed at length with the patient today.  Concerns regarding medicines are outlined above.  Orders Placed This Encounter  Procedures   LONG TERM MONITOR (3-14 DAYS)   EKG 12-Lead   No orders of the defined types were placed in this encounter.   Patient Instructions  Medication Instructions:  The current medical regimen is effective;  continue present plan and medications.  *If you need a refill on your cardiac medications before your next appointment, please call your pharmacy*   Testing/Procedures:  ZIO XT- Long Term Monitor Instructions  Your physician has requested you wear a ZIO patch monitor for 7 days.  This is a single patch monitor. [employer] supplies one patch monitor per enrollment. Additional stickers are not available. Please do not apply patch if you will be having a Nuclear Stress Test,  Echocardiogram, Cardiac CT, MRI, or Chest Xray during the period you would be wearing the   monitor. The patch cannot be worn during these tests. You cannot remove and re-apply the  ZIO XT patch monitor.  Your ZIO patch monitor will be mailed 3 day USPS to your address on file. It may take 3-5 days  to receive your monitor after you have been enrolled.  Once you have received your monitor, please review  the enclosed instructions. Your monitor  has already been registered assigning a specific monitor serial # to you.  Billing and Patient Assistance Program Information  We have supplied [employer] with any of your insurance information on file for billing purposes. [employer] offers a sliding scale Patient Assistance Program for patients that do not have  insurance, or whose insurance does not completely cover the cost of the ZIO monitor.  You must apply for the Patient Assistance Program to qualify for this discounted rate.  To apply, please call [employer] at [phone], select option 4, select option 2, ask to apply for  Patient Assistance Program. Meredeth Ide will ask your household income, and how many people  are in your household. They will quote your out-of-pocket cost based on that information.  [employer] will also be able to set up a 53-month, interest-free payment plan if needed.  Applying the monitor   Shave hair from upper left chest.  Hold abrader disc by orange tab. Rub abrader in 40 strokes over the upper left chest as  indicated in your monitor instructions.  Clean area with 4 enclosed alcohol pads. Let dry.  Apply patch as indicated in monitor instructions. Patch will be placed under collarbone on left  side of chest with arrow pointing upward.  Rub patch adhesive wings for 2 minutes. Remove white label marked "1". Remove the white  label marked "2". Rub patch adhesive wings for 2 additional minutes.  While looking in a mirror, press and release button in center of patch. A small green light will  flash 3-4 times. This will be your only indicator that the monitor has been  turned on.  Do not shower for the first 24 hours. You may shower after the first 24 hours.  Press the button if you feel a symptom. You will hear a small click. Record Date, Time and  Symptom in the Patient Logbook.  When you are ready to remove the patch, follow instructions on the last 2 pages of Patient  Logbook. Stick patch monitor onto the last page of Patient Logbook.  Place Patient Logbook in the blue and white box. Use locking tab on box and tape box closed  securely. The blue and white box has prepaid postage on it. Please place it in the mailbox as  soon as possible. Your physician should have your test results approximately 7 days after the  monitor has been mailed back to [facility].  Call [facility] Customer Care at [phone] if you have questions regarding  your ZIO XT patch monitor. Call them immediately if you see an orange light blinking on your  monitor.  If your monitor falls off in less than 4 days, contact our Monitor department at [phone].  If your monitor becomes loose or falls off after 4 days call [employer] at [phone] for  suggestions on securing your monitor    Follow-Up: At [facility], you and your health needs are our priority.  As part of our continuing mission to provide you with exceptional heart care, we have created designated Provider Care Teams.  These Care Teams include your primary Cardiologist (physician) and Advanced Practice Providers (APPs -  Physician Assistants and Nurse Practitioners) who all work together to provide you with the care you need, when you need it.  We recommend signing up for the patient portal called "MyChart".  Sign up information is provided on this After Visit Summary.  MyChart is used to connect with patients for Virtual Visits (Telemedicine).  Patients  are able to view lab/test results, encounter notes, upcoming appointments, etc.  Non-urgent messages can be sent to your provider as well.   To  learn more about what you can do with MyChart, go to [email or web address].    Your next appointment:   4 month(s)  Provider:   Lennie Odor, MD        Signed, Lenna Gilford. Flora Lipps, MD, [facility]  [facility]  [address] [place], [place] [address] [phone]  05/10/2022 10:08 AM

## 2022-05-10 ENCOUNTER — Encounter: Payer: Self-pay | Admitting: Cardiovascular Disease

## 2022-05-10 ENCOUNTER — Ambulatory Visit: Payer: BC Managed Care – PPO | Attending: Cardiovascular Disease

## 2022-05-10 ENCOUNTER — Ambulatory Visit: Payer: BC Managed Care – PPO | Attending: Cardiovascular Disease | Admitting: Cardiovascular Disease

## 2022-05-10 VITALS — BP 116/80 | HR 96 | Ht 67.0 in | Wt 195.2 lb

## 2022-05-10 DIAGNOSIS — R0602 Shortness of breath: Secondary | ICD-10-CM

## 2022-05-10 DIAGNOSIS — R002 Palpitations: Secondary | ICD-10-CM

## 2022-05-10 DIAGNOSIS — R55 Syncope and collapse: Secondary | ICD-10-CM

## 2022-05-10 NOTE — Patient Instructions (Signed)
Medication Instructions:  The current medical regimen is effective;  continue present plan and medications.  *If you need a refill on your cardiac medications before your next appointment, please call your pharmacy*   Testing/Procedures:  [place] Monitor Instructions  Your physician has requested you wear a ZIO patch monitor for 7 days.  This is a single patch monitor. [employer] supplies one patch monitor per enrollment. Additional stickers are not available. Please do not apply patch if you will be having a Nuclear Stress Test,  Echocardiogram, Cardiac CT, MRI, or Chest Xray during the period you would be wearing the  monitor. The patch cannot be worn during these tests. You cannot remove and re-apply the  ZIO XT patch monitor.  Your ZIO patch monitor will be mailed 3 day USPS to your address on file. It may take 3-5 days  to receive your monitor after you have been enrolled.  Once you have received your monitor, please review the enclosed instructions. Your monitor  has already been registered assigning a specific monitor serial # to you.  Billing and Patient Assistance Program Information  We have supplied [employer] with any of your insurance information on file for billing purposes. [employer] offers a sliding scale Patient Assistance Program for patients that do not have  insurance, or whose insurance does not completely cover the cost of the ZIO monitor.  You must apply for the Patient Assistance Program to qualify for this discounted rate.  To apply, please call [employer] at [phone], select option 4, select option 2, ask to apply for  Patient Assistance Program. Theodore Demark will ask your household income, and how many people  are in your household. They will quote your out-of-pocket cost based on that information.  [employer] will also be able to set up a 10-month interest-free payment plan if needed.  Applying the monitor   Shave hair from upper left chest.  Hold abrader  disc by orange tab. Rub abrader in 40 strokes over the upper left chest as  indicated in your monitor instructions.  Clean area with 4 enclosed alcohol pads. Let dry.  Apply patch as indicated in monitor instructions. Patch will be placed under collarbone on left  side of chest with arrow pointing upward.  Rub patch adhesive wings for 2 minutes. Remove white label marked "1". Remove the white  label marked "2". Rub patch adhesive wings for 2 additional minutes.  While looking in a mirror, press and release button in center of patch. A small green light will  flash 3-4 times. This will be your only indicator that the monitor has been turned on.  Do not shower for the first 24 hours. You may shower after the first 24 hours.  Press the button if you feel a symptom. You will hear a small click. Record Date, Time and  Symptom in the Patient Logbook.  When you are ready to remove the patch, follow instructions on the last 2 pages of Patient  Logbook. Stick patch monitor onto the last page of Patient Logbook.  Place Patient Logbook in the blue and white box. Use locking tab on box and tape box closed  securely. The blue and white box has prepaid postage on it. Please place it in the mailbox as  soon as possible. Your physician should have your test results approximately 7 days after the  monitor has been [facility]  Call [employer]aron[employer] [phone] you have questions regarding  your ZIO  XT patch monitor. Call them immediately if you see an orange light blinking on your  monitor.  If your monitor falls off in less than 4 days, contact our Monitor department at [phone].  If your monitor becomes loose or falls off after 4 days call [employer] at [phone] for  suggestions on securing your monitor    Follow-Up: At [facility], you and your health needs are our priority.  As part of our continuing mission to provide you with exceptional heart  care, we have created designated Provider Care Teams.  These Care Teams include your primary Cardiologist (physician) and Advanced Practice Providers (APPs -  Physician Assistants and Nurse Practitioners) who all work together to provide you with the care you need, when you need it.  We recommend signing up for the patient portal called "MyChart".  Sign up information is provided on this After Visit Summary.  MyChart is used to connect with patients for Virtual Visits (Telemedicine).  Patients are able to view lab/test results, encounter notes, upcoming appointments, etc.  Non-urgent messages can be sent to your provider as well.   To learn more about what you can do with MyChart, go to [email or web address].    Your next appointment:   4 month(s)  Provider:   Lennie Odor, MD

## 2022-05-10 NOTE — Progress Notes (Unsigned)
Enrolled for [employer] to mail a ZIO XT long term holter monitor to the patients address on file.  

## 2022-05-12 DIAGNOSIS — R002 Palpitations: Secondary | ICD-10-CM | POA: Diagnosis not present

## 2022-06-04 ENCOUNTER — Ambulatory Visit (HOSPITAL_COMMUNITY): Payer: BC Managed Care – PPO | Attending: Family Medicine

## 2022-06-04 DIAGNOSIS — R0609 Other forms of dyspnea: Secondary | ICD-10-CM | POA: Insufficient documentation

## 2022-06-04 LAB — ECHOCARDIOGRAM COMPLETE
Area-P 1/2: 3.91 cm2
S' Lateral: 3.1 cm

## 2022-08-18 ENCOUNTER — Ambulatory Visit (INDEPENDENT_AMBULATORY_CARE_PROVIDER_SITE_OTHER): Payer: BC Managed Care – PPO | Admitting: Physician Assistant

## 2022-08-18 ENCOUNTER — Encounter: Payer: Self-pay | Admitting: Physician Assistant

## 2022-08-18 ENCOUNTER — Ambulatory Visit (INDEPENDENT_AMBULATORY_CARE_PROVIDER_SITE_OTHER)
Admission: RE | Admit: 2022-08-18 | Discharge: 2022-08-18 | Disposition: A | Discharge status: Home / Self Care | Payer: BC Managed Care – PPO | Source: Ambulatory Visit | Attending: Physician Assistant | Admitting: Physician Assistant

## 2022-08-18 VITALS — BP 122/78 | HR 80

## 2022-08-18 DIAGNOSIS — K5900 Constipation, unspecified: Secondary | ICD-10-CM

## 2022-08-18 DIAGNOSIS — R110 Nausea: Secondary | ICD-10-CM | POA: Diagnosis not present

## 2022-08-18 DIAGNOSIS — U099 Post covid-19 condition, unspecified: Secondary | ICD-10-CM | POA: Diagnosis not present

## 2022-08-18 DIAGNOSIS — R159 Full incontinence of feces: Secondary | ICD-10-CM

## 2022-08-18 DIAGNOSIS — M3313 Other dermatomyositis without myopathy: Secondary | ICD-10-CM

## 2022-08-18 MED ORDER — ONDANSETRON HCL 4 MG PO TABS: ORAL_TABLET | ORAL | 3 refills | Status: DC

## 2022-08-18 MED ORDER — PANTOPRAZOLE SODIUM 40 MG PO TBEC: 40.0000 mg | DELAYED_RELEASE_TABLET | Freq: Every day | ORAL | 4 refills | Status: DC

## 2022-08-18 MED ORDER — LINACLOTIDE 72 MCG PO CAPS: 72.0000 ug | ORAL_CAPSULE | Freq: Every day | ORAL | 4 refills | Status: DC

## 2022-08-18 NOTE — Progress Notes (Signed)
Subjective:    Patient ID: Patricia Blevins, adult    DOB: 05-Mar-1985, 37 y.o.   MRN: [id number]  HPI Patricia Blevins is a pleasant 37 year old nonbinary individual, new to GI today referred by Nadyne Coombes, MD for evaluation of multiple GI symptoms.  Patient has not had prior GI evaluation. 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  She is on immunosuppressive therapy with Rituxan and CellCept and is also maintained on low-dose prednisone 5 mg daily.  She was initially diagnosed several years ago, has had progression of disease over the past couple of years and is now in a wheelchair. She unfortunately contracted COVID in December 2023 and was diagnosed with long COVID by March 2024.  She did have further debilitation during that time and that is when she started using a wheelchair on a regular basis.  She had multiple symptoms with COVID which have persisted since including nausea which has been chronic intermittent intermittent throughout the day without vomiting, diarrhea initially, now constipation, severe fatigue, muscle aches, dysautonomia and dyspnea.  She feels that she has had improvement in some of the symptoms over the past few months but then she will have periods of time where she will have recurrences of 1 or multiple of the symptoms. She does have prescriptions for Phenergan and Zofran but has not been using either of these on a scheduled basis.  She denies any regular heartburn or indigestion, no dysphagia or dyne aphasia.  Appetite not great but no true early satiety symptoms.  She has been on a very limited diet since having COVID but is gradually adding back foods, recently added back meat. Regarding her bowel issues she suffers now more with chronic constipation.  She started on MiraLAX 17 g on a fairly regular basis in June and will still have a bowel movement every 3 to 4 days.  She usually will have hard stools which can  be associated with straining and pain with defecation.  She is also having episodes of incontinence.  She says she gets an urge for a bowel movement but usually cannot get to the bathroom before she starts having a bowel movement even if she is passing a large hard stool.  She says she will have to sit for a long time eventually will pass a lot of stool, no melena or hematochezia. She is also having more daily issues with urinary incontinence.  He says she has been told that this can be associated with the dermatomyositis.  She has been placed on Zyrtec by urology for the urinary incontinence. She is also seeing a nutritionist to help with her diet.  She is usually drinking a smoothie in the evenings because she feels better if she does not eat so much prior to lying down.  She has developed food aversion to rice grains quinoa chickpeas broccoli carrots, all foods that she was trying to be pushed to eat when she had COVID and now has no desire for. Not currently on any NSAIDs, using Tylenol. Recent abdominal imaging.  Review of Systems. Pertinent positive and negative review of systems were noted in the above HPI section.  All other review of systems was otherwise negative.   Outpatient Encounter Medications as of 08/18/2022  Medication Sig   acetaminophen (TYLENOL) 325 MG tablet Take 650 mg by mouth every 6 (six) hours as needed for moderate pain.   calcium carbonate (TUMS - DOSED IN MG ELEMENTAL CALCIUM) 500  MG chewable tablet Chew 2,000 mg by mouth daily as needed for indigestion or heartburn.   Calcium Carbonate-Vit D-Min (CALCIUM 600+D3 PLUS MINERALS) 600-800 MG-UNIT TABS Take 2 tablets by mouth 2 (two) times daily.   clonazePAM (KLONOPIN) 0.5 MG tablet Take 0.5 mg by mouth 2 (two) times daily as needed for anxiety.   ibuprofen (ADVIL) 600 MG tablet Take 1 tablet (600 mg total) by mouth every 6 (six) hours as needed for cramping or moderate pain.   Lactobacillus (PROBIOTIC ACIDOPHILUS) CAPS Take 1  capsule by mouth daily. (Patient taking differently: Take 2 capsules by mouth daily.)   linaclotide (LINZESS) 72 MCG capsule Take 1 capsule (72 mcg total) by mouth daily before breakfast.   Multiple Vitamin (MULTIVITAMIN WITH MINERALS) TABS tablet Take 2 tablets by mouth daily.   mycophenolate (CELLCEPT) 500 MG tablet Take 1,000 mg by mouth 2 (two) times daily.   ondansetron (ZOFRAN) 4 MG tablet Take 1 tablet every morning, then repeat every 8 hours as needed.   pantoprazole (PROTONIX) 40 MG tablet Take 1 tablet (40 mg total) by mouth daily.   predniSONE (DELTASONE) 5 MG tablet Take 10 mg by mouth daily.   riTUXimab (RITUXAN IV) Inject 1 Dose into the vein See admin instructions. Yearly   valACYclovir (VALTREX) 500 MG tablet Take 1,000 mg by mouth daily.   [DISCONTINUED] fluticasone (FLONASE) 50 MCG/ACT nasal spray Place 2 sprays into both nostrils daily. (Patient not taking: Reported on 10/13/2016)   [DISCONTINUED] norethindrone (ORTHO MICRONOR) 0.35 MG tablet Take 1 tablet (0.35 mg total) by mouth daily. (Patient not taking: Reported on 05/18/2016)   No facility-administered encounter medications on file as of 08/18/2022.   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   Long-term use of immunosuppressant medication 03/05/2016   Multiple benign nevi of upper and lower extremities, and trunk 03/05/2016   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/05/2016   Candidiasis 01/22/2016   History of melanoma 09/12/2015   Lymphedema of upper  extremity following lymphadenectomy 07/31/2015   Leg weakness, bilateral 07/31/2015   Metastatic melanoma (HCC) 12/22/2010   PM (polymyositis) (HCC) 12/22/2010   Social History   Socioeconomic History   Marital status: Single    Spouse name: Not on file   Number of children: Not on file   Years of education: Not on file   Highest education level: Not on file  Occupational History   Not on file  Tobacco Use   Smoking status: Never   Smokeless tobacco: Never  Vaping Use   Vaping status: Never Used  Substance and Sexual Activity   Alcohol use: Not Currently   Drug use: No    Comment: in 20's used marijuana and cocaine   Sexual activity: Not on file  Other Topics Concern   Not on file  Social History Narrative   Not on file   Social Determinants of Health   Financial Resource Strain: Not on file  Food Insecurity: Not on file  Transportation Needs: Not on file  Physical Activity: Not on file  Stress: Not on  file  Social Connections: Not on file  Intimate Partner Violence: Not on file      Conlee's family history includes Arthritis in Janeann's father, maternal grandfather, maternal grandmother, and mother; Cancer in Eiley's paternal grandmother; Diabetes in Sharlyne's paternal grandmother; Heart disease in Tenesia's paternal grandfather; Hypertension in Marianela's brother and mother; Melanoma in Felesha's maternal grandfather; Stroke in Alexine's maternal grandmother.      Objective:    Vitals:   08/18/22 1424  BP: 122/78  Pulse: 80    Physical Exam Well-developed young W female (nonbinary) in no acute distress in a motorized wheelchair.   Weight, BMI  HEENT; nontraumatic normocephalic, EOMI, PE R LA, sclera anicteric. Oropharynx; not examined today Neck; supple, no JVD Cardiovascular; regular rate and rhythm with S1-S2, no murmur rub or gallop Pulmonary; Clear bilaterally Abdomen; soft, nontender, nondistended, no palpable mass or hepatosplenomegaly, bowel sounds are  active Rectal; not done today, wheelchair-bound Skin; benign exam, no jaundice rash or appreciable lesions Extremities; no clubbing cyanosis or edema skin warm and dry Neuro/Psych; alert and oriented x4, grossly nonfocal mood and affect appropriate        Assessment & Plan:   #13 37 year old nonbinary (female) with dermatomyositis, on immunosuppressive therapy with Rituxan and CellCept and low-dose prednisone, who has had decline in functioning now requiring a wheelchair after developing COVID-19 in December 2023 and being diagnosed with long COVID.  #2 multiple GI symptoms most of which onset with and post-COVID with chronic nausea without vomiting, constipation, intermittent partial fecal incontinence, food aversions  She has had some waxing and waning of the symptoms over the past months.  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  #3 status post laparoscopic hysterectomy #4 immunosuppressed state  Plan; plain abdominal films today to assess for degree of obstipation, if significant fecal load we will give a MiraLAX bowel purge , Continue MiraLAX 17 g in 8 ounces of water daily, increase water/fluid intake to 60 ounces per day. Will start a trial of low-dose Linzess at 72 mcg daily, if this is helpful have sent prescription.  If she does not benefit from Linzess we can switch to another agent.  Have asked her to start taking 1 Zofran 4 mg early in the morning, then plan second dose 6 to 8 hours later.  Hopefully we can control her nausea as she can improve her nutritional status. Start trial of Protonix 40 mg p.o. every morning AC breakfast prescription sent Hopefully she will have response to above, hope to avoid any invasive endoscopic evaluation at this time.  If she does not improve would consider gastric emptying scan. Patient will be established with Dr. Leonides Schanz, and will arrange follow-up with Dr. Leonides Schanz and I will be happy  to see her as well.    Oswald Hillock PA-C 08/18/2022   Cc: Dois Davenport, MD

## 2022-08-18 NOTE — Patient Instructions (Addendum)
_______________________________________________________  If your blood pressure at your visit was 140/90 or greater, please contact your primary care physician to follow up on this.  If you are age 38 or younger, your body mass index should be between 19-25. Your There is no height or weight on file to calculate BMI. If this is out of the aformentioned range listed, please consider follow up with your Primary Care Provider.  ________________________________________________________  The [facility] providers would like to encourage you to use [facility] to communicate with providers for non-urgent requests or questions.  Due to long hold times on the telephone, sending your provider a message by [facility] may be a faster and more efficient way to get a response.  Please allow 48 business hours for a response.  Please remember that this is for non-urgent requests.  _______________________________________________________  We have sent the following medications to your pharmacy for you to pick up at your convenience:  START: Linzess one capsule daily before breakfast each day.  Linzess works best when taken once a day every day, on an empty stomach, at least 30 minutes before your first meal of the day.  When Linzess is taken daily as directed:  *Constipation relief is typically felt in about a week *IBS-C patients may begin to experience relief from belly pain and overall abdominal symptoms (pain, discomfort, and bloating) in about 1 week,   with symptoms typically improving over 12 weeks.  Diarrhea may occur in the first 2 weeks -keep taking it.  The diarrhea should go away and you should start having normal, complete, full bowel movements. It may be helpful to start treatment when you can be near the comfort of your own bathroom, such as a weekend.   CONTINUE: Miralax daily as needed.  We have sent the following medications to your pharmacy for you to pick up at your convenience:  START:  Protonix 40mg  one tablet daily in the am before breakfast.  START: Zofran 4mg  one tablet every morning, then repeat every 8 hours as needed.  Your provider has requested that you have an abdominal x ray before leaving today. Please go to the basement floor to our Radiology department for the test.  You are scheduled to follow up with Dr Leonides Schanz on 11-18-22 at 10:50am.  Due to recent changes in healthcare laws, you may see the results of your imaging and laboratory studies on MyChart before your provider has had a chance to review them.  We understand that in some cases there may be results that are confusing or concerning to you. Not all laboratory results come back in the same time frame and the provider may be waiting for multiple results in order to interpret others.  Please give [place] 48 hours in order for your provider to thoroughly review all the results before contacting the office for clarification of your results.   Thank you for entrusting me with your care and choosing [facility].  Amy Esterwood, PA-C

## 2022-08-19 NOTE — Progress Notes (Signed)
I agree with the assessment and plan as outlined by Ms. Esterwood. ?

## 2022-08-23 ENCOUNTER — Telehealth: Payer: Self-pay

## 2022-08-23 NOTE — Telephone Encounter (Signed)
*  Gastro  Pharmacy Patient Advocate Encounter   Received notification from CoverMyMeds that prior authorization for Pantoprazole Sodium 40MG  dr tablets  is required/requested.   Insurance verification completed.   The patient is insured through [facility] .   Per test claim: PA required; PA submitted to [facility] via CoverMyMeds Key/confirmation #/EOC [id number] Status is pending

## 2022-08-25 ENCOUNTER — Telehealth: Payer: Self-pay

## 2022-08-25 NOTE — Telephone Encounter (Signed)
Have you received any communication about the Linzess not being covered for this patient?

## 2022-08-27 ENCOUNTER — Telehealth: Payer: Self-pay

## 2022-08-27 NOTE — Telephone Encounter (Signed)
Patricia Blevins  [facility] Clinical Pool (supporting Amy S Esterwood, PA-C)2 days ago    I took 6 doses, my last dose a bit before 2pm and I just vomited. I think it included my breakfast that I had at 930-10am.  Does this mean I need to try to do the 5-6 doses of miralax again? Or will it have absorbed.  I have not had a bowel movement yet      Called the patient to discuss. No answer. Left voicemail of my call. Asking for condition update.

## 2022-08-30 ENCOUNTER — Telehealth: Payer: Self-pay

## 2022-08-30 ENCOUNTER — Other Ambulatory Visit (HOSPITAL_COMMUNITY): Payer: Self-pay

## 2022-08-30 ENCOUNTER — Other Ambulatory Visit: Payer: Self-pay

## 2022-08-30 MED ORDER — LUBIPROSTONE 8 MCG PO CAPS: 8.0000 ug | ORAL_CAPSULE | Freq: Two times a day (BID) | ORAL | 1 refills | Status: DC

## 2022-08-30 NOTE — Telephone Encounter (Signed)
*  Gastro  Pharmacy Patient Advocate Encounter   Received notification from CoverMyMeds that prior authorization for Lubiprostone capsules  is required/requested.   Insurance verification completed.   The patient is insured through [facility] .   Per test claim: PA required; PA submitted to [employer] via CoverMyMeds Key/confirmation #/EOC [facility] Status is pending

## 2022-08-30 NOTE — Telephone Encounter (Signed)
Yes copay is $0

## 2022-08-30 NOTE — Telephone Encounter (Signed)
Linzess is not covered under patient's plan. We received an approval for Pantoprazole 40 mg tablets.

## 2022-08-30 NOTE — Progress Notes (Unsigned)
Cardiology Office Note:   Date:  08/31/2022  NAME:  Lesle Campton    MRN: [id number] DOB:  07-21-1985   PCP:  Dois Davenport, MD  Cardiologist:  Reatha Harps, MD  Electrophysiologist:  None   Referring MD: Dois Davenport, MD   Chief Complaint  Patient presents with   Follow-up    History of Present Illness:   Nayela Stoica is a 37 y.o. adult with a hx of dermatomyositis who presents for follow-up of dizziness and palpitations.  No further symptoms of dizziness or lightheadedness.  Echo was normal.  Monitor was normal.  Suspect this was postviral in nature.  Drinking plenty of water.  No dizziness or lightheadedness.  No further passing out spells.  Symptoms have resolved.  CV exam normal.  Lakeyshia continues to work with rheumatology and neurology.  May need treatment for dermatomyositis.  There is some confusion about if this condition is in remission or not.  Problem List Dermatomyositis  Metastatic melanoma -2009 (in remission) 3. Lymphedema   Past Medical History: Past Medical History:  Diagnosis Date   Anxiety    Asthma    Depression    Dermatomyositis (HCC)    Melanoma (HCC)    Pneumonia    Substance abuse (HCC)    No drink since October    Past Surgical History: Past Surgical History:  Procedure Laterality Date   CYSTOSCOPY N/A 12/29/2020   Procedure: CYSTOSCOPY;  Surgeon: Essie Hart, MD;  Location: [facility] OR;  Service: Gynecology;  Laterality: N/A;   lymph node removal     melanoma removal  2008-2009   SKIN BIOPSY  2021   Between breast for psoriasis   THIGH / KNEE SOFT TISSUE BIOPSY Right 2000   TOTAL LAPAROSCOPIC HYSTERECTOMY WITH SALPINGECTOMY N/A 12/29/2020   Procedure: TOTAL LAPAROSCOPIC HYSTERECTOMY WITH BILATERAL SALPINGECTOMY;  Surgeon: Essie Hart, MD;  Location: [facility] OR;  Service: Gynecology;  Laterality: N/A;    Current Medications: Current Meds  Medication Sig   acetaminophen (TYLENOL) 325 MG tablet Take 650 mg by mouth every 6 (six) hours  as needed for moderate pain.   baclofen (LIORESAL) 10 MG tablet Take 5-10 mg by mouth 3 (three) times daily as needed.   busPIRone (BUSPAR) 5 MG tablet Take 5 mg by mouth 2 (two) times daily.   calcium carbonate (TUMS - DOSED IN MG ELEMENTAL CALCIUM) 500 MG chewable tablet Chew 2,000 mg by mouth daily as needed for indigestion or heartburn.   Calcium Carbonate-Vit D-Min (CALCIUM 600+D3 PLUS MINERALS) 600-800 MG-UNIT TABS Take 2 tablets by mouth 2 (two) times daily.   cetirizine (ZYRTEC) 10 MG tablet Take 10 mg by mouth daily. Pt Half of tablet 0.5 mg   clonazePAM (KLONOPIN) 0.5 MG tablet Take 0.5 mg by mouth 2 (two) times daily as needed for anxiety.   gabapentin (NEURONTIN) 100 MG capsule Take 100-200 mg by mouth 2 (two) times daily.   gabapentin (NEURONTIN) 400 MG capsule Take 400 mg by mouth daily.   ibuprofen (ADVIL) 600 MG tablet Take 1 tablet (600 mg total) by mouth every 6 (six) hours as needed for cramping or moderate pain.   ketoconazole (NIZORAL) 2 % shampoo Apply 1 Application topically 2 (two) times a week.   Lactobacillus (PROBIOTIC ACIDOPHILUS) CAPS Take 1 capsule by mouth daily. (Patient taking differently: Take 2 capsules by mouth daily.)   lubiprostone (AMITIZA) 8 MCG capsule Take 1 capsule (8 mcg total) by mouth 2 (two) times daily with a meal.   Multiple  Vitamin (MULTIVITAMIN WITH MINERALS) TABS tablet Take 2 tablets by mouth daily.   mycophenolate (CELLCEPT) 500 MG tablet Take 1,000 mg by mouth 2 (two) times daily.   Naltrexone HCl, Pain, (NALTREX) 4.5 MG CAPS Take 4.5 mg by mouth daily.   ondansetron (ZOFRAN) 4 MG tablet Take 1 tablet every morning, then repeat every 8 hours as needed.   pantoprazole (PROTONIX) 40 MG tablet Take 1 tablet (40 mg total) by mouth daily.   predniSONE (DELTASONE) 5 MG tablet Take 10 mg by mouth daily.   riTUXimab (RITUXAN IV) Inject 1 Dose into the vein See admin instructions. Yearly   trimethoprim (TRIMPEX) 100 MG tablet Take 100 mg by mouth  daily.   valACYclovir (VALTREX) 500 MG tablet Take 1,000 mg by mouth daily.     Allergies:    Zofran [ondansetron]   Social History: Social History   Socioeconomic History   Marital status: Single    Spouse name: Not on file   Number of children: Not on file   Years of education: Not on file   Highest education level: Not on file  Occupational History   Not on file  Tobacco Use   Smoking status: Never   Smokeless tobacco: Never  Vaping Use   Vaping status: Never Used  Substance and Sexual Activity   Alcohol use: Not Currently   Drug use: No    Comment: in 20's used marijuana and cocaine   Sexual activity: Not on file  Other Topics Concern   Not on file  Social History Narrative   Not on file   Social Determinants of Health   Financial Resource Strain: Not on file  Food Insecurity: Not on file  Transportation Needs: Not on file  Physical Activity: Not on file  Stress: Not on file  Social Connections: Not on file     Family History: The patient's family history includes Arthritis in Areen's father, maternal grandfather, maternal grandmother, and mother; Cancer in Tekeshia's paternal grandmother; Diabetes in Tamyia's paternal grandmother; Heart disease in Qiana's paternal grandfather; Hypertension in Yesly's brother and mother; Melanoma in Brandi's maternal grandfather; Stroke in Marielouise's maternal grandmother.  ROS:   All other ROS reviewed and negative. Pertinent positives noted in the HPI.     EKGs/Labs/Other Studies Reviewed:   The following studies were personally reviewed by me today:  EKG:  EKG is not ordered today.        Zio 05/27/2022 Impression: No arrhythmias detected.  Rare ectopy.   TTE 06/04/2022  1. Left ventricular ejection fraction, by estimation, is 50 to 55%. The  left ventricle has low normal function. The left ventricle has no regional  wall motion abnormalities. Left ventricular diastolic parameters were  normal.   2. Right ventricular  systolic function is normal. The right ventricular  size is normal.   3. The mitral valve is normal in structure. No evidence of mitral valve  regurgitation. No evidence of mitral stenosis.   4. The aortic valve is tricuspid. Aortic valve regurgitation is not  visualized. No aortic stenosis is present.   5. The inferior vena cava is normal in size with greater than 50%  respiratory variability, suggesting right atrial pressure of 3 mmHg.   Recent Labs: No results found for requested labs within last 365 days.   Recent Lipid Panel    Component Value Date/Time   CHOL 184 01/22/2016 1018   TRIG 60.0 01/22/2016 1018   HDL 90.80 01/22/2016 1018   CHOLHDL 2 01/22/2016 1018  VLDL 12.0 01/22/2016 1018   LDLCALC 82 01/22/2016 1018    Physical Exam:   VS:  BP 120/80 (BP Location: Left Arm, Patient Position: Sitting, Cuff Size: Normal)   Pulse (!) 104   Ht 5\' 7"  (1.702 m)   Wt 195 lb (88.5 kg)   LMP 12/24/2020   SpO2 98%   BMI 30.54 kg/m    Wt Readings from Last 3 Encounters:  08/31/22 195 lb (88.5 kg)  05/10/22 195 lb 3.2 oz (88.5 kg)  12/29/20 210 lb (95.3 kg)    General: Well nourished, well developed, in no acute distress Head: Atraumatic, normal size  Eyes: PEERLA, EOMI  Neck: Supple, no JVD Endocrine: No thryomegaly Cardiac: Normal S1, S2; RRR; no murmurs, rubs, or gallops Lungs: Clear to auscultation bilaterally, no wheezing, rhonchi or rales  Abd: Soft, nontender, no hepatomegaly  Ext: No edema, pulses 2+ Musculoskeletal: No deformities, BUE and BLE strength normal and equal Skin: Warm and dry, no rashes   Neuro: Alert and oriented to person, place, time, and situation, CNII-XII grossly intact, no focal deficits  Psych: Normal mood and affect   ASSESSMENT:   Izumi Cuadrado is a 37 y.o. adult who presents for the following: 1. SOB (shortness of breath) on exertion   2. Palpitations   3. Syncope and collapse     PLAN:   1. SOB (shortness of breath) on  exertion 2. Palpitations 3. Syncope and collapse -No further syncope.  Symptoms have improved with hydration.  Attempting to work with PT.  Quite inactive due to dermatomyositis.  May need treatment for this.  Overall symptoms of shortness of breath, palpitations and syncope have resolved.  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  Given that symptoms have improved no treatment is needed.  We discussed hydration and to try to get more active.  Alyeska will work with physical therapy on this.  Given that symptoms have resolved and workup was negative [place] will see [place] back as needed.  Disposition: Return if symptoms worsen or fail to improve.  Medication Adjustments/Labs and Tests Ordered: Current medicines are reviewed at length with the patient today.  Concerns regarding medicines are outlined above.  No orders of the defined types were placed in this encounter.  No orders of the defined types were placed in this encounter.  Patient Instructions  Medication Instructions:  The current medical regimen is effective;  continue present plan and medications as directed. Please refer to the Current Medication list given to you today.  *If you need a refill on your cardiac medications before your next appointment, please call your pharmacy*  Lab Work: NONE If you have labs (blood work) drawn today and your tests are completely normal, you will receive your results only by: MyChart Message (if you have MyChart) OR A paper copy in the mail If you have any lab test that is abnormal or we need to change your treatment, we will call you to review the results.   Testing/Procedures: NONE  Follow-Up: At [facility], you and your health needs are our priority.  As part of our continuing mission to provide you with exceptional heart care, we have created designated Provider Care Teams.  These Care Teams include your primary Cardiologist (physician) and Advanced Practice Providers (APPs -   Physician Assistants and Nurse Practitioners) who all work together to provide you with the care you need, when you need it.  We recommend signing up for the patient portal called "MyChart".  Sign up information is provided on this After Visit Summary.  MyChart is used to connect with patients for Virtual Visits (Telemedicine).  Patients are able to view lab/test results, encounter notes, upcoming appointments, etc.  Non-urgent messages can be sent to your provider as well.   To learn more about what you can do with MyChart, go to [email or web address].    Your next appointment:   AS NEEDED   Provider:   Reatha Harps, MD     Other Instructions    Time Spent with Patient: I have spent a total of 25 minutes with patient reviewing hospital notes, telemetry, EKGs, labs and examining the patient as well as establishing an assessment and plan that was discussed with the patient.  > 50% of time was spent in direct patient care.  Signed, Lenna Gilford. Flora Lipps, MD, [facility]  [facility]  [address], [address] [place], [place] [address] [phone]  08/31/2022 11:01 AM

## 2022-08-30 NOTE — Telephone Encounter (Signed)
Pharmacy Patient Advocate Encounter  Received notification from [facility] that Prior Authorization for Pantroprazole 40 mg tablet has been APPROVED from 08/23/2022 to 08/23/2023. Ran test claim, Copay is $0. This test claim was processed through [facility]- copay amounts may vary at other pharmacies due to pharmacy/plan contracts, or as the patient moves through the different stages of their insurance plan.   PA #/Case ID/Reference #: [id number]

## 2022-08-30 NOTE — Telephone Encounter (Signed)
Patient seen by Mike Gip, PA-C and started on Linzess 72 mcg. Insurance does not cover the Linzess. Not responding well to Miralax. Can we try lubiprostone 8 mcg BID?

## 2022-08-31 ENCOUNTER — Encounter: Payer: Self-pay | Admitting: Cardiovascular Disease

## 2022-08-31 ENCOUNTER — Ambulatory Visit: Payer: BC Managed Care – PPO | Attending: Cardiovascular Disease | Admitting: Cardiovascular Disease

## 2022-08-31 ENCOUNTER — Other Ambulatory Visit: Payer: Self-pay

## 2022-08-31 VITALS — BP 120/80 | HR 104 | Ht 67.0 in | Wt 195.0 lb

## 2022-08-31 DIAGNOSIS — R0602 Shortness of breath: Secondary | ICD-10-CM | POA: Diagnosis not present

## 2022-08-31 DIAGNOSIS — R002 Palpitations: Secondary | ICD-10-CM | POA: Diagnosis not present

## 2022-08-31 DIAGNOSIS — R55 Syncope and collapse: Secondary | ICD-10-CM | POA: Diagnosis not present

## 2022-08-31 MED ORDER — TRULANCE 3 MG PO TABS: 1.0000 | ORAL_TABLET | Freq: Every day | ORAL | 6 refills | Status: DC

## 2022-08-31 NOTE — Telephone Encounter (Signed)
Pharmacy Patient Advocate Encounter  Received notification from [facility] that Prior Authorization for Lubiprostone has been DENIED. Please advise how you'd like to proceed. Full denial letter will be uploaded to the media tab. See denial reason below.   Patient must try/fail Trulance.

## 2022-08-31 NOTE — Patient Instructions (Addendum)
Medication Instructions:  The current medical regimen is effective;  continue present plan and medications as directed. Please refer to the Current Medication list given to you today.  *If you need a refill on your cardiac medications before your next appointment, please call your pharmacy*  Lab Work: NONE If you have labs (blood work) drawn today and your tests are completely normal, you will receive your results only by: MyChart Message (if you have MyChart) OR A paper copy in the mail If you have any lab test that is abnormal or we need to change your treatment, we will call you to review the results.   Testing/Procedures: NONE  Follow-Up: At [facility], you and your health needs are our priority.  As part of our continuing mission to provide you with exceptional heart care, we have created designated Provider Care Teams.  These Care Teams include your primary Cardiologist (physician) and Advanced Practice Providers (APPs -  Physician Assistants and Nurse Practitioners) who all work together to provide you with the care you need, when you need it.  We recommend signing up for the patient portal called "MyChart".  Sign up information is provided on this After Visit Summary.  MyChart is used to connect with patients for Virtual Visits (Telemedicine).  Patients are able to view lab/test results, encounter notes, upcoming appointments, etc.  Non-urgent messages can be sent to your provider as well.   To learn more about what you can do with MyChart, go to [email or web address].    Your next appointment:   AS NEEDED   Provider:   Reatha Harps, MD     Other Instructions

## 2022-08-31 NOTE — Telephone Encounter (Signed)
Can the prescription be changed to Trulance?

## 2022-09-09 ENCOUNTER — Other Ambulatory Visit (HOSPITAL_COMMUNITY): Payer: Self-pay

## 2022-09-09 ENCOUNTER — Telehealth: Payer: Self-pay | Admitting: Pharmacy Technician

## 2022-09-09 NOTE — Telephone Encounter (Signed)
Pharmacy Patient Advocate Encounter   Received notification from CoverMyMeds that prior authorization for TRULANCE 3MG  is required/requested.   Insurance verification completed.   The patient is insured through [facility] .   Per test claim: PA required; PA submitted to [facility] via CoverMyMeds Key/confirmation #/EOC [id number] Status is pending

## 2022-09-15 ENCOUNTER — Other Ambulatory Visit (HOSPITAL_COMMUNITY): Payer: Self-pay

## 2022-09-15 NOTE — Telephone Encounter (Signed)
Pharmacy Patient Advocate Encounter  Received notification from [facility] that Prior Authorization for TRULANCE has been APPROVED from 09/09/2022 to 09/09/2023. Ran test claim, Copay is $0.00. This test claim was processed through [facility]- copay amounts may vary at other pharmacies due to pharmacy/plan contracts, or as the patient moves through the different stages of their insurance plan.

## 2022-10-29 ENCOUNTER — Other Ambulatory Visit: Payer: Self-pay | Admitting: Internal Medicine

## 2022-11-02 ENCOUNTER — Other Ambulatory Visit: Payer: Self-pay

## 2022-11-02 MED ORDER — LUBIPROSTONE 8 MCG PO CAPS: 8.0000 ug | ORAL_CAPSULE | Freq: Two times a day (BID) | ORAL | 3 refills | Status: DC

## 2022-11-04 ENCOUNTER — Other Ambulatory Visit: Payer: Self-pay

## 2022-11-04 MED ORDER — TRULANCE 3 MG PO TABS: 1.0000 | ORAL_TABLET | Freq: Every day | ORAL | 6 refills | Status: DC

## 2022-11-08 ENCOUNTER — Other Ambulatory Visit (HOSPITAL_COMMUNITY): Payer: Self-pay

## 2022-11-08 ENCOUNTER — Telehealth: Payer: Self-pay | Admitting: Pharmacy Technician

## 2022-11-08 NOTE — Telephone Encounter (Signed)
Pharmacy Patient Advocate Encounter   Received notification from CoverMyMeds that prior authorization for LUBIPROSTONE is required/requested.   Insurance verification completed.   The patient is insured through [facility] .   Per test claim: PA required; PA submitted to [employer] via CoverMyMeds Key/confirmation #/EOC [id number] Status is pending

## 2022-11-08 NOTE — Telephone Encounter (Signed)
Pharmacy Patient Advocate Encounter  Received notification from [facility] that Prior Authorization for LUBIPROSTONE has been APPROVED from 10.28.24 to 10.28.25. Ran test claim, Copay is $0. This test claim was processed through [facility]- copay amounts may vary at other pharmacies due to pharmacy/plan contracts, or as the patient moves through the different stages of their insurance plan.   PA #/Case ID/Reference #: [id number]

## 2022-11-10 ENCOUNTER — Other Ambulatory Visit (HOSPITAL_COMMUNITY): Payer: Self-pay

## 2022-11-10 ENCOUNTER — Other Ambulatory Visit: Payer: Self-pay

## 2022-11-10 ENCOUNTER — Telehealth: Payer: Self-pay

## 2022-11-10 MED ORDER — LUBIPROSTONE 8 MCG PO CAPS: 8.0000 ug | ORAL_CAPSULE | Freq: Two times a day (BID) | ORAL | 3 refills | Status: DC

## 2022-11-10 NOTE — Telephone Encounter (Signed)
Opened in error

## 2022-11-18 ENCOUNTER — Ambulatory Visit: Payer: BC Managed Care – PPO | Admitting: Internal Medicine

## 2022-11-18 ENCOUNTER — Encounter: Payer: Self-pay | Admitting: Internal Medicine

## 2022-11-18 VITALS — BP 110/70 | HR 88 | Ht 67.0 in | Wt 195.0 lb

## 2022-11-18 DIAGNOSIS — K5900 Constipation, unspecified: Secondary | ICD-10-CM

## 2022-11-18 DIAGNOSIS — R110 Nausea: Secondary | ICD-10-CM

## 2022-11-18 DIAGNOSIS — R140 Abdominal distension (gaseous): Secondary | ICD-10-CM | POA: Diagnosis not present

## 2022-11-18 NOTE — Patient Instructions (Signed)
Start taking Miralax daily  Follow up in 6 months _______________________________________________________  If your blood pressure at your visit was 140/90 or greater, please contact your primary care physician to follow up on this.  _______________________________________________________  If you are age 38 or older, your body mass index should be between 23-30. Your Body mass index is 30.54 kg/m. If this is out of the aforementioned range listed, please consider follow up with your Primary Care Provider.  If you are age 17 or younger, your body mass index should be between 19-25. Your Body mass index is 30.54 kg/m. If this is out of the aformentioned range listed, please consider follow up with your Primary Care Provider.   ________________________________________________________  The [facility] providers would like to encourage you to use [facility] to communicate with providers for non-urgent requests or questions.  Due to long hold times on the telephone, sending your provider a message by [facility] may be a faster and more efficient way to get a response.  Please allow 48 business hours for a response.  Please remember that this is for non-urgent requests.  _______________________________________________________  Thank you for entrusting me with your care and for choosing [facility], Dr. Eulah Pont

## 2022-11-18 NOTE — Progress Notes (Signed)
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 After she started Amitiza, she was having more regular BMs. Then because she was having insurance difficulties with getting the Amitiza approved, she tried to stop the Amitiza and it has been okay. She is now drinking more water after taking fiber, which has helped. Her stools have been hard at times and it can be very painful to pass the stool. The stools have fecal urgency. This week her stools are more like the rabbit poops and she is usually having two per day. The stools are not fully regular but she is pooping every day. She has some pain in her lower abdomen right after she eats. When she needs to poop, she has gas discomfort. Once she has a BM, her ab pain resolved. She is working with a nutritionist so she is trying to working to achieve a soft solid log poop that is lighter brown in color. She feels like her digestive system is trying to work his way back there. Certain foods cause more bloating. She has tried the low FODMAP diet in the past, which did help. She doesn't want to get more severely constipated in the future. For her nausea, she is using Zofran BID but wants to try weaning off the Zofran if possible. Denies chest burning or regurgitation. Denies marijuana. Denies alcohol. She is eating smaller meals. She is using prebiotic and probiotic. Headaches are not particularly bad.    Past Medical History:  Diagnosis Date   Anxiety    Asthma    Depression    Dermatomyositis (HCC)    Long COVID    Melanoma (HCC)    Pneumonia    Substance abuse (HCC)    No drink since October     Past Surgical History:  Procedure Laterality Date   CYSTOSCOPY N/A  12/29/2020   Procedure: CYSTOSCOPY;  Surgeon: Essie Hart, MD;  Location: [facility] OR;  Service: Gynecology;  Laterality: N/A;   lymph node removal     melanoma removal  2008-2009   SKIN BIOPSY  2021   Between breast for psoriasis   THIGH / KNEE SOFT TISSUE BIOPSY Right 2000   TOTAL LAPAROSCOPIC HYSTERECTOMY WITH SALPINGECTOMY N/A 12/29/2020   Procedure: TOTAL LAPAROSCOPIC HYSTERECTOMY WITH BILATERAL SALPINGECTOMY;  Surgeon: Essie Hart, MD;  Location: [facility] OR;  Service: Gynecology;  Laterality: N/A;   Family History  Problem Relation Age of Onset   Arthritis Mother    Hypertension Mother    Breast cancer Mother    Arthritis Father    Hypertension Brother    Arthritis Maternal Grandmother    Stroke Maternal Grandmother    Arthritis Maternal Grandfather    Melanoma Maternal Grandfather    Diabetes Paternal Grandmother    Breast cancer Paternal Grandmother    Heart disease Paternal Grandfather    Colon cancer Neg Hx    Esophageal cancer Neg Hx    Social History   Tobacco Use   Smoking status: Never   Smokeless tobacco: Never  Vaping Use   Vaping status: Never Used  Substance Use Topics   Alcohol use: Not Currently   Drug use: No    Comment: in 20's used marijuana and cocaine   Current Outpatient Medications  Medication  Sig Dispense Refill   acetaminophen (TYLENOL) 325 MG tablet Take 650 mg by mouth every 6 (six) hours as needed for moderate pain.     baclofen (LIORESAL) 10 MG tablet Take 5-10 mg by mouth 3 (three) times daily as needed.     busPIRone (BUSPAR) 5 MG tablet Take 5 mg by mouth 3 (three) times daily.     calcium carbonate (TUMS - DOSED IN MG ELEMENTAL CALCIUM) 500 MG chewable tablet Chew 2,000 mg by mouth daily as needed for indigestion or heartburn.     Calcium Carbonate-Vit D-Min (CALCIUM 600+D3 PLUS MINERALS) 600-800 MG-UNIT TABS Take 2 tablets by mouth 2 (two) times daily.     cetirizine (ZYRTEC) 10 MG tablet Take 10 mg by mouth daily. Pt Half of tablet 0.5 mg      clonazePAM (KLONOPIN) 0.5 MG tablet Take 0.5 mg by mouth 2 (two) times daily as needed for anxiety.     gabapentin (NEURONTIN) 100 MG capsule Take 100-200 mg by mouth 2 (two) times daily.     gabapentin (NEURONTIN) 400 MG capsule Take 400 mg by mouth daily.     ibuprofen (ADVIL) 600 MG tablet Take 1 tablet (600 mg total) by mouth every 6 (six) hours as needed for cramping or moderate pain. 60 tablet 2   ketoconazole (NIZORAL) 2 % shampoo Apply 1 Application topically 2 (two) times a week.     Lactobacillus (PROBIOTIC ACIDOPHILUS) CAPS Take 1 capsule by mouth daily. (Patient taking differently: Take 2 capsules by mouth daily.)     Multiple Vitamin (MULTIVITAMIN WITH MINERALS) TABS tablet Take 2 tablets by mouth daily.     mycophenolate (CELLCEPT) 500 MG tablet Take 1,000 mg by mouth 2 (two) times daily.     Naltrexone HCl, Pain, (NALTREX) 4.5 MG CAPS Take 4.5 mg by mouth daily.     ondansetron (ZOFRAN) 4 MG tablet Take 1 tablet every morning, then repeat every 8 hours as needed. 60 tablet 3   predniSONE (DELTASONE) 5 MG tablet Take 10 mg by mouth daily.     riTUXimab (RITUXAN IV) Inject 1 Dose into the vein See admin instructions. Yearly     trimethoprim (TRIMPEX) 100 MG tablet Take 100 mg by mouth daily.     valACYclovir (VALTREX) 500 MG tablet Take 1,000 mg by mouth daily.     lubiprostone (AMITIZA) 8 MCG capsule Take 1 capsule (8 mcg total) by mouth 2 (two) times daily with a meal. (Patient not taking: Reported on 11/18/2022) 60 capsule 3   No current facility-administered medications for this visit.   Allergies  Allergen Reactions   Pantoprazole Other (See Comments)    Extreme muscle cramps     Review of Systems: All systems reviewed and negative except where noted in HPI.   Physical Exam: BP 110/70   Pulse 88   Ht 5\' 7"  (1.702 m)   Wt 195 lb (88.5 kg) Comment: per pt, they are in wheelchair  LMP 12/24/2020   BMI 30.54 kg/m  Constitutional: Pleasant,well-developed, female in no  acute distress. Sitting in wheelchair HEENT: Normocephalic and atraumatic. Conjunctivae are normal. No scleral icterus. Cardiovascular: Normal rate, regular rhythm.  Pulmonary/chest: Effort normal and breath sounds normal. No wheezing, rales or rhonchi. Abdominal: Soft, nondistended, nontender. Bowel sounds active throughout. There are no masses palpable. No hepatomegaly. Extremities: Trace edema Neurological: Alert and oriented to person place and time. Skin: Skin is warm and dry. No rashes noted. Psychiatric: Normal mood and affect. Behavior is normal.  Labs 07/2022:  CBC unremarkable. K mildly low at 3.3.  LFTs normal.  Magnesium normal.  Vitamin D normal.  TSH normal.  Ferritin normal.  CRP elevated at 4.14.  ESR normal.  KUB 08/18/22: IMPRESSION: Moderate constipation.  ASSESSMENT AND PLAN: Constipation Nausea  Bloating Patient has been making great strides in trying to help regulate her bowel habits and watching what she eats.  Patient will review the low FODMAP diet to see if there are any foods that may be causing more issues with bloating.  Patient's bloating may also be due to constipation.  Patient was previously on Amitiza but then stopped this medication after her insurance had difficulty approving it temporarily.  I told her it would be okay to try using MiraLAX and titrating it to achieve well-formed bowel movements.  She can use Amitiza as needed if she develops more severe constipation.  Patient is continuing to use Zofran to help with her nausea issues.  She will try weaning down on her Zofran to see if she is able to tolerate this okay.  Her nausea may also be related to her constipation issues. - Patient will revisit the low FODMAP diet - Okay to use Miralax every day. Will titrate up-and-down - Continue Amitiza PRN - Continue Zofran. Will to wean off if possible - Continue smaller portions of meals - RTC 6 months.  Could consider SIBO breath test in the future  Eulah Pont, MD  I spent 41 minutes of time, including in depth chart review, independent review of results as outlined above, communicating results with the patient directly, face-to-face time with the patient, coordinating care, ordering studies and medications as appropriate, and documentation.

## 2022-12-04 LAB — EXTERNAL GENERIC LAB PROCEDURE: COLOGUARD: NEGATIVE

## 2022-12-04 LAB — COLOGUARD: COLOGUARD: NEGATIVE

## 2022-12-10 ENCOUNTER — Encounter: Payer: Self-pay | Admitting: Internal Medicine

## 2022-12-13 ENCOUNTER — Other Ambulatory Visit: Payer: Self-pay

## 2022-12-13 DIAGNOSIS — R110 Nausea: Secondary | ICD-10-CM

## 2022-12-13 DIAGNOSIS — R140 Abdominal distension (gaseous): Secondary | ICD-10-CM

## 2022-12-31 ENCOUNTER — Other Ambulatory Visit: Payer: Self-pay | Admitting: Physician Assistant

## 2023-01-06 ENCOUNTER — Other Ambulatory Visit: Payer: Self-pay | Admitting: *Deleted

## 2023-01-06 ENCOUNTER — Inpatient Hospital Stay: Payer: BC Managed Care – PPO

## 2023-01-06 ENCOUNTER — Inpatient Hospital Stay: Payer: BC Managed Care – PPO | Attending: Hematology and Oncology | Admitting: Hematology and Oncology

## 2023-01-06 VITALS — BP 125/83 | HR 78 | Temp 97.6°F | Resp 17 | Ht 67.0 in | Wt 178.1 lb

## 2023-01-06 DIAGNOSIS — C439 Malignant melanoma of skin, unspecified: Secondary | ICD-10-CM

## 2023-01-06 DIAGNOSIS — Z993 Dependence on wheelchair: Secondary | ICD-10-CM | POA: Diagnosis not present

## 2023-01-06 DIAGNOSIS — I890 Lymphedema, not elsewhere classified: Secondary | ICD-10-CM

## 2023-01-06 DIAGNOSIS — M3320 Polymyositis, organ involvement unspecified: Secondary | ICD-10-CM | POA: Diagnosis not present

## 2023-01-06 DIAGNOSIS — Z85820 Personal history of malignant melanoma of skin: Secondary | ICD-10-CM | POA: Insufficient documentation

## 2023-01-06 DIAGNOSIS — Z803 Family history of malignant neoplasm of breast: Secondary | ICD-10-CM | POA: Diagnosis not present

## 2023-01-06 DIAGNOSIS — Z7952 Long term (current) use of systemic steroids: Secondary | ICD-10-CM | POA: Diagnosis not present

## 2023-01-06 DIAGNOSIS — Z9189 Other specified personal risk factors, not elsewhere classified: Secondary | ICD-10-CM

## 2023-01-06 NOTE — Progress Notes (Signed)
[facility] CONSULT NOTE  Patient Care Team: Dois Davenport, MD as PCP - General (Family Medicine) O'Neal, Ronnald Ramp, MD as PCP - Cardiology (Cardiology) Janalyn Harder, MD (Inactive) as Consulting Physician (Dermatology)  CHIEF COMPLAINTS/PURPOSE OF CONSULTATION:  At high risk for breast cancer  HISTORY OF PRESENTING ILLNESS:    The patient, with a history of polymyositis, dermatomyositis, severe melanoma, lymph node removal, and long-term prednisone use, presents with concerns regarding her risk for breast cancer. She has been using a wheelchair for mobility and is currently on Rituxan and high doses of CellCept, with plans to start immunoglobulin. The patient has a family history of breast cancer, with her mother, paternal grandmother, and paternal half-aunt all having been diagnosed.  . The patient also mentions a history of melanoma and lymph node removal in her armpits, which has led to concerns about her immune system.   I reviewed her records extensively and collaborated the history with the patient.  SUMMARY OF ONCOLOGIC HISTORY: Oncology History   No history exists.     MEDICAL HISTORY:  Past Medical History:  Diagnosis Date   Anxiety    Asthma    Depression    Dermatomyositis (HCC)    Long COVID    Melanoma (HCC)    Pneumonia    Substance abuse (HCC)    No drink since October    SURGICAL HISTORY: Past Surgical History:  Procedure Laterality Date   CYSTOSCOPY N/A 12/29/2020   Procedure: CYSTOSCOPY;  Surgeon: Essie Hart, MD;  Location: [facility];  Service: Gynecology;  Laterality: N/A;   lymph node removal     melanoma removal  2008-2009   SKIN BIOPSY  2021   Between breast for psoriasis   THIGH / KNEE SOFT TISSUE BIOPSY Right 2000   TOTAL LAPAROSCOPIC HYSTERECTOMY WITH SALPINGECTOMY N/A 12/29/2020   Procedure: TOTAL LAPAROSCOPIC HYSTERECTOMY WITH BILATERAL SALPINGECTOMY;  Surgeon: Essie Hart, MD;  Location: [facility] OR;  Service: Gynecology;   Laterality: N/A;    SOCIAL HISTORY: Social History   Socioeconomic History   Marital status: Single    Spouse name: Not on file   Number of children: 0   Years of education: Not on file   Highest education level: Not on file  Occupational History   Not on file  Tobacco Use   Smoking status: Never   Smokeless tobacco: Never  Vaping Use   Vaping status: Never Used  Substance and Sexual Activity   Alcohol use: Not Currently   Drug use: No    Comment: in 20's used marijuana and cocaine   Sexual activity: Not on file  Other Topics Concern   Not on file  Social History Narrative   Not on file   Social Drivers of Health   Financial Resource Strain: Not on file  Food Insecurity: Not on file  Transportation Needs: Not on file  Physical Activity: Not on file  Stress: Not on file  Social Connections: Not on file  Intimate Partner Violence: Not on file    FAMILY HISTORY: Family History  Problem Relation Age of Onset   Arthritis Mother    Hypertension Mother    Breast cancer Mother    Arthritis Father    Hypertension Brother    Arthritis Maternal Grandmother    Stroke Maternal Grandmother    Arthritis Maternal Grandfather    Melanoma Maternal Grandfather    Diabetes Paternal Grandmother    Breast cancer Paternal Grandmother    Heart disease Paternal Grandfather  Colon cancer Neg Hx    Esophageal cancer Neg Hx     ALLERGIES:  is allergic to pantoprazole.  MEDICATIONS:  Current Outpatient Medications  Medication Sig Dispense Refill   acetaminophen (TYLENOL) 325 MG tablet Take 650 mg by mouth every 6 (six) hours as needed for moderate pain.     baclofen (LIORESAL) 10 MG tablet Take 5-10 mg by mouth 3 (three) times daily as needed.     busPIRone (BUSPAR) 5 MG tablet Take 5 mg by mouth 3 (three) times daily.     calcium carbonate (TUMS - DOSED IN MG ELEMENTAL CALCIUM) 500 MG chewable tablet Chew 2,000 mg by mouth daily as needed for indigestion or heartburn.      Calcium Carbonate-Vit D-Min (CALCIUM 600+D3 PLUS MINERALS) 600-800 MG-UNIT TABS Take 2 tablets by mouth 2 (two) times daily.     cetirizine (ZYRTEC) 10 MG tablet Take 10 mg by mouth daily. Pt Half of tablet 0.5 mg     clonazePAM (KLONOPIN) 0.5 MG tablet Take 0.5 mg by mouth 2 (two) times daily as needed for anxiety.     gabapentin (NEURONTIN) 100 MG capsule Take 100-200 mg by mouth 2 (two) times daily.     gabapentin (NEURONTIN) 400 MG capsule Take 400 mg by mouth daily.     ibuprofen (ADVIL) 600 MG tablet Take 1 tablet (600 mg total) by mouth every 6 (six) hours as needed for cramping or moderate pain. 60 tablet 2   ketoconazole (NIZORAL) 2 % shampoo Apply 1 Application topically 2 (two) times a week.     Lactobacillus (PROBIOTIC ACIDOPHILUS) CAPS Take 1 capsule by mouth daily. (Patient taking differently: Take 2 capsules by mouth daily.)     lubiprostone (AMITIZA) 8 MCG capsule Take 1 capsule (8 mcg total) by mouth 2 (two) times daily with a meal. (Patient not taking: Reported on 11/18/2022) 60 capsule 3   Multiple Vitamin (MULTIVITAMIN WITH MINERALS) TABS tablet Take 2 tablets by mouth daily.     mycophenolate (CELLCEPT) 500 MG tablet Take 1,000 mg by mouth 2 (two) times daily.     Naltrexone HCl, Pain, (NALTREX) 4.5 MG CAPS Take 4.5 mg by mouth daily.     ondansetron (ZOFRAN) 4 MG tablet TAKE 1 TABLET EVERY MORNING, THEN REPEAT EVERY 8 HOURS AS NEEDED. 60 tablet 0   predniSONE (DELTASONE) 5 MG tablet Take 10 mg by mouth daily.     riTUXimab (RITUXAN IV) Inject 1 Dose into the vein See admin instructions. Yearly     trimethoprim (TRIMPEX) 100 MG tablet Take 100 mg by mouth daily.     valACYclovir (VALTREX) 500 MG tablet Take 1,000 mg by mouth daily.     No current facility-administered medications for this visit.    REVIEW OF SYSTEMS:   Constitutional: Denies fevers, chills or abnormal night sweats   All other systems were reviewed with the patient and are negative.  PHYSICAL  EXAMINATION: ECOG PERFORMANCE STATUS: 2 - Symptomatic, <50% confined to bed  Vitals:   01/06/23 1541  BP: 125/83  Pulse: 78  Resp: 17  Temp: 97.6 F (36.4 C)  SpO2: 100%   Filed Weights   01/06/23 1541  Weight: 178 lb 1.6 oz (80.8 kg)    GENERAL:alert, no distress and comfortable    LABORATORY DATA:  I have reviewed the data as listed Lab Results  Component Value Date   WBC 11.7 (H) 12/30/2020   HGB 9.8 (L) 12/30/2020   HCT 30.4 (L) 12/30/2020   MCV 96.5  12/30/2020   PLT 252 12/30/2020   Lab Results  Component Value Date   NA 139 12/25/2020   K 3.5 12/25/2020   CL 106 12/25/2020   CO2 25 12/25/2020    RADIOGRAPHIC STUDIES: I have personally reviewed the radiological reports and agreed with the findings in the report.  ASSESSMENT AND PLAN:   10-year risk of breast cancer: 3.8% (average risk 1.4%) Lifetime risk of breast cancer: 27.6% (average risk 11%)  Risk reduction: I discussed.  Pharmacological and nonpharmacological risk reduction measures.  We did not recommend tamoxifen therapy. Breast cancer surveillance: We discussed the role of mammograms alternating with MRIs but I recommended that she undergo contrast-enhanced mammograms which would be much better imaging quality without requiring her to do the MRI.  Given her history of polymyositis it would be very difficult for her to do the MRIs anyway.  Genetic counseling will be arranged. Follow-up with Mardella Layman in 1 year.  She will continue her follow-ups annually with contrast-enhanced mammograms. All questions were answered. The patient knows to call the clinic with any problems, questions or concerns.    Tamsen Meek, MD 01/06/23

## 2023-01-06 NOTE — Progress Notes (Signed)
No lab orders needed. Lab appt canceled.

## 2023-01-07 ENCOUNTER — Inpatient Hospital Stay: Payer: BC Managed Care – PPO | Admitting: Hematology and Oncology

## 2023-01-07 ENCOUNTER — Inpatient Hospital Stay: Payer: BC Managed Care – PPO

## 2023-01-15 ENCOUNTER — Other Ambulatory Visit: Payer: Self-pay | Admitting: Medical Genetics

## 2023-01-28 ENCOUNTER — Encounter: Payer: Self-pay | Admitting: Internal Medicine

## 2023-01-28 ENCOUNTER — Other Ambulatory Visit: Payer: Self-pay

## 2023-01-28 MED ORDER — RIFAXIMIN 550 MG PO TABS: 550.0000 mg | ORAL_TABLET | Freq: Three times a day (TID) | ORAL | 0 refills | Status: DC

## 2023-01-28 NOTE — Progress Notes (Signed)
Left message on machine to call back  

## 2023-01-28 NOTE — Progress Notes (Signed)
Patient's SIBO breath test (collected 01/16/2023) results showed a normal increase in hydrogen of 15 bpm (normal less than 20), a normal increase in methane of 1 ppm (normal less than 12 ppm), and an elevated increase in combined hydrogen and methane of 16 ppm (normal less than 15).  This could suggest that she has SIBO.  I would recommend a course of treatment with rifaximin 550 3 times daily for 14 days.  If were not able to get this approved, then we can switch to another antibiotic in the future.    Beth, could you please call the patient and let her know about these results and the treatment recommendations? Thank you

## 2023-01-31 NOTE — Progress Notes (Signed)
Medication transmitted to the pharmacy.

## 2023-02-03 ENCOUNTER — Other Ambulatory Visit: Payer: Self-pay | Admitting: Physician Assistant

## 2023-02-04 ENCOUNTER — Other Ambulatory Visit: Payer: Self-pay

## 2023-02-04 ENCOUNTER — Other Ambulatory Visit (HOSPITAL_COMMUNITY): Payer: Self-pay

## 2023-02-04 ENCOUNTER — Telehealth: Payer: Self-pay | Admitting: Pharmacy Technician

## 2023-02-04 DIAGNOSIS — F411 Generalized anxiety disorder: Secondary | ICD-10-CM | POA: Diagnosis not present

## 2023-02-04 DIAGNOSIS — M3312 Other dermatopolymyositis with myopathy: Secondary | ICD-10-CM | POA: Diagnosis not present

## 2023-02-04 DIAGNOSIS — K638211 Small intestinal bacterial overgrowth, hydrogen-subtype: Secondary | ICD-10-CM | POA: Diagnosis not present

## 2023-02-04 DIAGNOSIS — F332 Major depressive disorder, recurrent severe without psychotic features: Secondary | ICD-10-CM | POA: Diagnosis not present

## 2023-02-04 MED ORDER — ONDANSETRON HCL 4 MG PO TABS: ORAL_TABLET | ORAL | 0 refills | Status: DC

## 2023-02-04 NOTE — Telephone Encounter (Signed)
Pharmacy Patient Advocate Encounter   Received notification from CoverMyMeds that prior authorization for XIFAXAN 550MG  is required/requested.   Insurance verification completed.   The patient is insured through [facility] .   Per test claim: PA required; PA submitted to above mentioned insurance via CoverMyMeds Key/confirmation #/EOC [id number] Status is pending

## 2023-02-09 DIAGNOSIS — M8588 Other specified disorders of bone density and structure, other site: Secondary | ICD-10-CM | POA: Diagnosis not present

## 2023-02-09 DIAGNOSIS — Z7952 Long term (current) use of systemic steroids: Secondary | ICD-10-CM | POA: Diagnosis not present

## 2023-02-09 DIAGNOSIS — N958 Other specified menopausal and perimenopausal disorders: Secondary | ICD-10-CM | POA: Diagnosis not present

## 2023-02-14 DIAGNOSIS — Z01419 Encounter for gynecological examination (general) (routine) without abnormal findings: Secondary | ICD-10-CM | POA: Diagnosis not present

## 2023-02-14 DIAGNOSIS — U099 Post covid-19 condition, unspecified: Secondary | ICD-10-CM | POA: Diagnosis not present

## 2023-02-14 DIAGNOSIS — M3313 Other dermatomyositis without myopathy: Secondary | ICD-10-CM | POA: Diagnosis not present

## 2023-02-14 DIAGNOSIS — Z803 Family history of malignant neoplasm of breast: Secondary | ICD-10-CM | POA: Diagnosis not present

## 2023-02-14 DIAGNOSIS — Z1330 Encounter for screening examination for mental health and behavioral disorders, unspecified: Secondary | ICD-10-CM | POA: Diagnosis not present

## 2023-02-14 NOTE — Telephone Encounter (Signed)
What is the status of this PA please

## 2023-02-22 ENCOUNTER — Other Ambulatory Visit (HOSPITAL_COMMUNITY): Payer: Self-pay

## 2023-02-22 ENCOUNTER — Telehealth: Payer: Self-pay | Admitting: Pharmacy Technician

## 2023-02-22 DIAGNOSIS — E559 Vitamin D deficiency, unspecified: Secondary | ICD-10-CM | POA: Diagnosis not present

## 2023-02-22 DIAGNOSIS — M3312 Other dermatopolymyositis with myopathy: Secondary | ICD-10-CM | POA: Diagnosis not present

## 2023-02-22 DIAGNOSIS — R7301 Impaired fasting glucose: Secondary | ICD-10-CM | POA: Diagnosis not present

## 2023-02-22 DIAGNOSIS — G43909 Migraine, unspecified, not intractable, without status migrainosus: Secondary | ICD-10-CM | POA: Diagnosis not present

## 2023-02-22 DIAGNOSIS — G4733 Obstructive sleep apnea (adult) (pediatric): Secondary | ICD-10-CM | POA: Diagnosis not present

## 2023-02-22 DIAGNOSIS — U099 Post covid-19 condition, unspecified: Secondary | ICD-10-CM | POA: Diagnosis not present

## 2023-02-22 DIAGNOSIS — Z0001 Encounter for general adult medical examination with abnormal findings: Secondary | ICD-10-CM | POA: Diagnosis not present

## 2023-02-22 DIAGNOSIS — F332 Major depressive disorder, recurrent severe without psychotic features: Secondary | ICD-10-CM | POA: Diagnosis not present

## 2023-02-22 DIAGNOSIS — R5383 Other fatigue: Secondary | ICD-10-CM | POA: Diagnosis not present

## 2023-02-22 DIAGNOSIS — D849 Immunodeficiency, unspecified: Secondary | ICD-10-CM | POA: Diagnosis not present

## 2023-02-22 DIAGNOSIS — K5904 Chronic idiopathic constipation: Secondary | ICD-10-CM | POA: Diagnosis not present

## 2023-02-22 NOTE — Telephone Encounter (Signed)
Pharmacy Patient Advocate Encounter  Received notification from [facility] that Prior Authorization for XIFAXAN 550MG  has been APPROVED from 2.11.25 to 2.11.26. Unable to obtain price due to refill too soon rejection, last fill date 2.11.25 next available fill date2.22.25   PA #/Case ID/Reference #:  [id number]

## 2023-02-22 NOTE — Telephone Encounter (Signed)
Pharmacy Patient Advocate Encounter   Received notification from CoverMyMeds that prior authorization for XIFAXAN 550MG  is required/requested.   Insurance verification completed.   The patient is insured through [facility] .   Per test claim: PA required; PA submitted to above mentioned insurance via CoverMyMeds Key/confirmation #/EOC [id number] Status is pending

## 2023-02-25 DIAGNOSIS — N390 Urinary tract infection, site not specified: Secondary | ICD-10-CM | POA: Diagnosis not present

## 2023-02-28 ENCOUNTER — Encounter: Payer: Self-pay | Admitting: Internal Medicine

## 2023-03-01 ENCOUNTER — Encounter: Payer: Self-pay | Admitting: Genetic Counselor

## 2023-03-01 ENCOUNTER — Inpatient Hospital Stay: Payer: BC Managed Care – PPO

## 2023-03-01 ENCOUNTER — Inpatient Hospital Stay: Payer: BC Managed Care – PPO | Attending: Hematology and Oncology | Admitting: Genetic Counselor

## 2023-03-01 ENCOUNTER — Other Ambulatory Visit: Payer: Self-pay | Admitting: Genetic Counselor

## 2023-03-01 DIAGNOSIS — Z803 Family history of malignant neoplasm of breast: Secondary | ICD-10-CM | POA: Diagnosis not present

## 2023-03-01 DIAGNOSIS — C439 Malignant melanoma of skin, unspecified: Secondary | ICD-10-CM

## 2023-03-01 DIAGNOSIS — Z808 Family history of malignant neoplasm of other organs or systems: Secondary | ICD-10-CM | POA: Diagnosis not present

## 2023-03-01 DIAGNOSIS — Z85820 Personal history of malignant melanoma of skin: Secondary | ICD-10-CM | POA: Diagnosis not present

## 2023-03-01 DIAGNOSIS — G4733 Obstructive sleep apnea (adult) (pediatric): Secondary | ICD-10-CM | POA: Diagnosis not present

## 2023-03-01 LAB — GENETIC SCREENING ORDER

## 2023-03-01 NOTE — Progress Notes (Addendum)
 REFERRING PROVIDER: Serena Croissant, MD [address] [place],  [place] [address]  PRIMARY PROVIDER:  Dois Davenport, MD  PRIMARY REASON FOR VISIT:  1. Family history of breast cancer   2. Family history of melanoma   3. History of melanoma      HISTORY OF PRESENT ILLNESS:     Molyneux, a 38 y.o. adult, was seen for a [place] cancer genetics consultation at the request of Dr. Pamelia Hoit due to a personal and family history of cancer.    Luallen presents to clinic today to discuss the possibility of a hereditary predisposition to cancer, genetic testing, and to further clarify Zanobia's future cancer risks, as well as potential cancer risks for family members.   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 The treatment plan included surgery.    CANCER HISTORY:  Oncology History   No history exists.     RISK FACTORS:  Menarche was at age 89.  First live birth at age N/A.  OCP use for approximately  15  years.  Ovaries intact: yes.  Hysterectomy: yes.  Menopausal status: premenopausal.  HRT use: 0 years. Colonoscopy: no; not examined. Mammogram within the last year: yes. Number of breast biopsies: 0. Any excessive radiation exposure in the past: no  Past Medical History:  Diagnosis Date   Anxiety    Asthma    Depression    Dermatomyositis (HCC)    Family history of breast cancer    Family history of melanoma    Long COVID    Melanoma (HCC)    Pneumonia    Substance abuse (HCC)    No drink since October    Past Surgical History:  Procedure Laterality Date   CYSTOSCOPY N/A 12/29/2020   Procedure: CYSTOSCOPY;  Surgeon: Essie Hart, MD;  Location: [facility] OR;  Service: Gynecology;  Laterality: N/A;   lymph node removal     melanoma removal  2008-2009   SKIN BIOPSY  2021   Between breast for psoriasis   THIGH / KNEE SOFT TISSUE BIOPSY Right 2000   TOTAL LAPAROSCOPIC HYSTERECTOMY WITH SALPINGECTOMY N/A 12/29/2020    Procedure: TOTAL LAPAROSCOPIC HYSTERECTOMY WITH BILATERAL SALPINGECTOMY;  Surgeon: Essie Hart, MD;  Location: [facility] OR;  Service: Gynecology;  Laterality: N/A;    Social History   Socioeconomic History   Marital status: Single    Spouse name: Not on file   Number of children: 0   Years of education: Not on file   Highest education level: Not on file  Occupational History   Not on file  Tobacco Use   Smoking status: Never   Smokeless tobacco: Never  Vaping Use   Vaping status: Never Used  Substance and Sexual Activity   Alcohol use: Not Currently   Drug use: No    Comment: in 20's used marijuana and cocaine   Sexual activity: Not on file  Other Topics Concern   Not on file  Social History Narrative   Not on file   Social Drivers of Health   Financial Resource Strain: Not on file  Food Insecurity: Not on file  Transportation Needs: Not on file  Physical Activity: Not on file  Stress: Not on file  Social Connections: Not on file     FAMILY HISTORY:  We obtained a detailed, 4-generation family history.  Significant diagnoses are listed below: Family History  Problem Relation Age of Onset   Arthritis Mother  Hypertension Mother    Breast cancer Mother 53   Arthritis Father    Hypertension Brother    Melanoma Brother 71   Breast cancer Paternal Aunt 60       father's maternal half sister   Arthritis Maternal Grandmother    Stroke Maternal Grandmother    Arthritis Maternal Grandfather    Melanoma Maternal Grandfather    Diabetes Paternal Grandmother    Breast cancer Paternal Grandmother 19   Heart disease Paternal Grandfather    Colon cancer Neg Hx    Esophageal cancer Neg Hx       The patient does not have children. She has two brothers, one who was diagnosed with melanoma in his 71's.  Both parents are living.  The patient's father is healthy.  He has a maternal half brother and sister, his sister had breast cancer at 12.  His mother also had breast cancer  at 39.  The patient's mother had breast cancer at 57. She had one sister who was cancer free.  Her father had melanoma.    Abeln is unaware of previous family history of genetic testing for hereditary cancer risks.  There is no reported Ashkenazi Jewish ancestry. There is no known consanguinity.  GENETIC COUNSELING ASSESSMENT:   Vultaggio is a 39 y.o. adult with a personal and family history of cancer which is somewhat suggestive of a hereditary melanoma syndrome and predisposition to cancer given the number of cases of melanoma in the family. We, therefore, discussed and recommended the following at today's visit.   DISCUSSION: We discussed that, in general, most cancer is not inherited in families, but instead is sporadic or familial. Sporadic cancers occur by chance and typically happen at older ages (>50 years) as this type of cancer is caused by genetic changes acquired during an individual's lifetime. Some families have more cancers than would be expected by chance; however, the ages or types of cancer are not consistent with a known genetic mutation or known genetic mutations have been ruled out. This type of familial cancer is thought to be due to a combination of multiple genetic, environmental, hormonal, and lifestyle factors. While this combination of factors likely increases the risk of cancer, the exact source of this risk is not currently identifiable or testable.  We discussed that 5 - 10% of cancer is hereditary, with most cases melanoma associated with CDKN2A.  There are other genes that can be associated with hereditary melanoma cancer syndromes.  These include BAP1, MITF and others.  We discussed that testing is beneficial for several reasons including knowing how to follow affected individuals and understand if other family members could be at risk for cancer and allow them to undergo genetic testing.   We reviewed the characteristics, features and inheritance patterns of hereditary  cancer syndromes. We also discussed genetic testing, including the appropriate family members to test, the process of testing, insurance coverage and turn-around-time for results. We discussed the implications of a negative, positive, carrier and/or variant of uncertain significant result.   Goral  was offered a common hereditary cancer panel (36+ genes) and an expanded pan-cancer panel (70+ genes).   Durocher was informed of the benefits and limitations of each panel, including that expanded pan-cancer panels contain genes that do not have clear management guidelines at this point in time.  We also discussed that as the number of genes included on a panel increases, the chances of variants of uncertain significance increases.   Buckingham decided to pursue  genetic testing for the CancerNext-Expanded+RNAinsight gene panel.   The CancerNext-Expanded gene panel offered by [facility] and includes sequencing, rearrangement, and RNA analysis for the following 76 genes: AIP, ALK, APC, ATM, AXIN2, BAP1, BARD1, BMPR1A, BRCA1, BRCA2, BRIP1, CDC73, CDH1, CDK4, CDKN1B, CDKN2A, CEBPA, CHEK2, CTNNA1, DDX41, DICER1, ETV6, FH, FLCN, GATA2, LZTR1, MAX, MBD4, MEN1, MET, MLH1, MSH2, MSH3, MSH6, MUTYH, NF1, NF2, NTHL1, PALB2, PHOX2B, PMS2, POT1, PRKAR1A, PTCH1, PTEN, RAD51C, RAD51D, RB1, RET, RUNX1, SDHA, SDHAF2, SDHB, SDHC, SDHD, SMAD4, SMARCA4, SMARCB1, SMARCE1, STK11, SUFU, TMEM127, TP53, TSC1, TSC2, VHL, and WT1 (sequencing and deletion/duplication); EGFR, HOXB13, KIT, MITF, PDGFRA, POLD1, and POLE (sequencing only); EPCAM and GREM1 (deletion/duplication only).    Based on   Dicenzo's personal and family history of cancer, [place] meets medical criteria for genetic testing. Despite that [place] meets criteria, Wava may still have an out of pocket cost. We discussed that if Alika's out of pocket cost for testing is over $100, the laboratory will call and confirm whether [place] wants to proceed with testing.  If the out of  pocket cost of testing is less than $100 Takeria will be billed by the genetic testing laboratory.   We discussed that some people do not want to undergo genetic testing due to fear of genetic discrimination.  The Genetic Information Nondiscrimination Act (GINA) was signed into federal law in 2008. GINA prohibits health insurers and most employers from discriminating against individuals based on genetic information (including the results of genetic tests and family history information). According to GINA, health insurance companies cannot consider genetic information to be a preexisting condition, nor can they use it to make decisions regarding coverage or rates. GINA also makes it illegal for most employers to use genetic information in making decisions about hiring, firing, promotion, or terms of employment. It is important to note that GINA does not offer protections for life insurance, disability insurance, or long-term care insurance. GINA does not apply to those in the [employer], those who work for companies with less than 15 employees, and new life insurance or long-term disability insurance policies.  Health status due to a cancer diagnosis is not protected under GINA. More information about GINA can be found by visiting [email or web address].  PLAN: After considering the risks, benefits, and limitations,   Guster provided informed consent to pursue genetic testing and the blood sample was sent to [facility] for analysis of the CancerNext-Expanded+RNAinsight panel. Results should be available within approximately 2-3 weeks' time, at which point they will be disclosed by telephone to   [facility], as will any additional recommendations warranted by these results.   Henriquez will receive a summary of Keaunna's genetic counseling visit and a copy of Dwanda's results once available. This information will also be available in Epic.   Lastly, we encouraged   Loyer to remain in contact with cancer  genetics annually so that we can continuously update the family history and inform [place] of any changes in cancer genetics and testing that may be of benefit for this family.     Staller's questions were answered to Ayeisha's satisfaction today. Our contact information was provided should additional questions or concerns arise. Thank you for the referral and allowing [place] to share in the care of your patient.   Twan Harkin P. Lowell Guitar, MS, CGC Licensed, Patent attorney [email or web address] phone: [phone]  60 minutes were spent on the date of the encounter in service to the patient including preparation, face-to-face consultation, documentation and care coordination.  The patient's father  attended the visit.. Drs. Meliton Rattan, and/or Chewey were available for questions, if needed..    _______________________________________________________________________ For Office Staff:  Number of people involved in session: 2 Was an Intern/ student involved with case: no

## 2023-03-17 DIAGNOSIS — G4733 Obstructive sleep apnea (adult) (pediatric): Secondary | ICD-10-CM | POA: Diagnosis not present

## 2023-03-18 ENCOUNTER — Telehealth: Payer: Self-pay | Admitting: Genetic Counselor

## 2023-03-18 ENCOUNTER — Ambulatory Visit: Payer: Self-pay | Admitting: Genetic Counselor

## 2023-03-18 ENCOUNTER — Encounter: Payer: Self-pay | Admitting: Genetic Counselor

## 2023-03-18 DIAGNOSIS — Z1379 Encounter for other screening for genetic and chromosomal anomalies: Secondary | ICD-10-CM

## 2023-03-18 NOTE — Telephone Encounter (Signed)
 Revealed negative genetic testing.  Discussed that we do not know why Patricia Blevins has melanoma cancer or why there is cancer in the family. It could be due to a different gene that we are not testing, or maybe our current technology may not be able to pick something up.  It will be important for [facility] to keep in contact with genetics to keep up with whether additional testing may be needed.  RB1 VUS identified.  This will not change medical management.

## 2023-03-18 NOTE — Telephone Encounter (Signed)
 LM on VM that results are back and to please call.  Left CB instructions.

## 2023-03-18 NOTE — Progress Notes (Signed)
 HPI:    Gaertner was previously seen in the [facility] due to a personal and family history of cancer and concerns regarding a hereditary predisposition to cancer. Please refer to our prior cancer genetics clinic note for more information regarding our discussion, assessment and recommendations, at the time.   Brent's recent genetic test results were disclosed to [place], as were recommendations warranted by these results. These results and recommendations are discussed in more detail below.  CANCER HISTORY:  Oncology History  Metastatic melanoma (HCC)  12/22/2010 Initial Diagnosis   Metastatic melanoma (HCC)    Genetic Testing   Negative genetic testing on the CancerNext-Expanded+RNAinsight panel. RB1 p.K814N (c.2442A>C) VUS identified. The report date is March 17, 2023.  The CancerNext-Expanded gene panel offered by [facility] and includes sequencing, rearrangement, and RNA analysis for the following 76 genes: AIP, ALK, APC, ATM, AXIN2, BAP1, BARD1, BMPR1A, BRCA1, BRCA2, BRIP1, CDC73, CDH1, CDK4, CDKN1B, CDKN2A, CEBPA, CHEK2, CTNNA1, DDX41, DICER1, ETV6, FH, FLCN, GATA2, LZTR1, MAX, MBD4, MEN1, MET, MLH1, MSH2, MSH3, MSH6, MUTYH, NF1, NF2, NTHL1, PALB2, PHOX2B, PMS2, POT1, PRKAR1A, PTCH1, PTEN, RAD51C, RAD51D, RB1, RET, RUNX1, SDHA, SDHAF2, SDHB, SDHC, SDHD, SMAD4, SMARCA4, SMARCB1, SMARCE1, STK11, SUFU, TMEM127, TP53, TSC1, TSC2, VHL, and WT1 (sequencing and deletion/duplication); EGFR, HOXB13, KIT, MITF, PDGFRA, POLD1, and POLE (sequencing only); EPCAM and GREM1 (deletion/duplication only).       FAMILY HISTORY:  We obtained a detailed, 4-generation family history.  Significant diagnoses are listed below: Family History  Problem Relation Age of Onset   Arthritis Mother    Hypertension Mother    Breast cancer Mother 35   Arthritis Father    Hypertension Brother    Melanoma Brother 12   Breast cancer Paternal Aunt 59       father's maternal half sister    Arthritis Maternal Grandmother    Stroke Maternal Grandmother    Arthritis Maternal Grandfather    Melanoma Maternal Grandfather    Diabetes Paternal Grandmother    Breast cancer Paternal Grandmother 81   Heart disease Paternal Grandfather    Colon cancer Neg Hx    Esophageal cancer Neg Hx          The patient does not have children. She has two brothers, one who was diagnosed with melanoma in his 90's.  Both parents are living.   The patient's father is healthy.  He has a maternal half brother and sister, his sister had breast cancer at 66.  His mother also had breast cancer at 25.   The patient's mother had breast cancer at 31. She had one sister who was cancer free.  Her father had melanoma.     Wenk is unaware of previous family history of genetic testing for hereditary cancer risks.  There is no reported Ashkenazi Jewish ancestry. There is no known consanguinity.  GENETIC TEST RESULTS: Genetic testing reported out on March 17, 2023 through the CancerNext-Expanded+RNAinsight cancer panel found no pathogenic mutations. The CancerNext-Expanded gene panel offered by [facility] and includes sequencing, rearrangement, and RNA analysis for the following 76 genes: AIP, ALK, APC, ATM, AXIN2, BAP1, BARD1, BMPR1A, BRCA1, BRCA2, BRIP1, CDC73, CDH1, CDK4, CDKN1B, CDKN2A, CEBPA, CHEK2, CTNNA1, DDX41, DICER1, ETV6, FH, FLCN, GATA2, LZTR1, MAX, MBD4, MEN1, MET, MLH1, MSH2, MSH3, MSH6, MUTYH, NF1, NF2, NTHL1, PALB2, PHOX2B, PMS2, POT1, PRKAR1A, PTCH1, PTEN, RAD51C, RAD51D, RB1, RET, RUNX1, SDHA, SDHAF2, SDHB, SDHC, SDHD, SMAD4, SMARCA4, SMARCB1, SMARCE1, STK11, SUFU, TMEM127, TP53, TSC1, TSC2, VHL, and WT1 (sequencing and deletion/duplication);  EGFR, HOXB13, KIT, MITF, PDGFRA, POLD1, and POLE (sequencing only); EPCAM and GREM1 (deletion/duplication only). The test report has been scanned into EPIC and is located under the Molecular Pathology section of the Results Review tab.  A portion of the  result report is included below for reference.     We discussed with   Cederberg that because current genetic testing is not perfect, it is possible there may be a gene mutation in one of these genes that current testing cannot detect, but that chance is small.  We also discussed, that there could be another gene that has not yet been discovered, or that we have not yet tested, that is responsible for the cancer diagnoses in the family. It is also possible there is a hereditary cause for the cancer in the family that   Parekh did not inherit and therefore was not identified in Tomeko's testing.  Therefore, it is important to remain in touch with cancer genetics in the future so that we can continue to offer   Biddinger the most up to date genetic testing.   Genetic testing did identify a variant of uncertain significance (VUS) was identified in the RB1 gene called p.K814N (c.2442A>C).  At this time, it is unknown if this variant is associated with increased cancer risk or if this is a normal finding, but most variants such as this get reclassified to being inconsequential. It should not be used to make medical management decisions. With time, we suspect the lab will determine the significance of this variant, if any. If we do learn more about it, we will try to contact   Cataldi to discuss it further. However, it is important to stay in touch with [place] periodically and keep the address and phone number up to date.  ADDITIONAL GENETIC TESTING: We discussed with   Ator that Shaunessy's genetic testing was fairly extensive.  If there are genes identified to increase cancer risk that can be analyzed in the future, we would be happy to discuss and coordinate this testing at that time.    CANCER SCREENING RECOMMENDATIONS:   Sylva's test result is considered negative (normal).  This means that we have not identified a hereditary cause for Neasia's personal and family history of cancer at this time. Most cancers  happen by chance and this negative test suggests that Jhordyn's personal and family history of cancer may fall into this category.    Possible reasons for   Heffley's negative genetic test include:  1. There may be a gene mutation in one of these genes that current testing methods cannot detect but that chance is small.  2. There could be another gene that has not yet been discovered, or that we have not yet tested, that is responsible for the cancer diagnoses in the family.  3.  There may be no hereditary risk for cancer in the family. The cancers in   [place] and/or Nyomie's family may be sporadic/familial or due to other genetic and environmental factors. 4. It is also possible there is a hereditary cause for the cancer in the family that   Ehinger did not inherit.  Therefore, it is recommended Jaemarie continue to follow the cancer management and screening guidelines provided by [facility] primary healthcare provider. An individual's cancer risk and medical management are not determined by genetic test results alone. Overall cancer risk assessment incorporates additional factors, including personal medical history, family history, and any available genetic information that may result in a personalized plan for  cancer prevention and surveillance  RECOMMENDATIONS FOR FAMILY MEMBERS:   Since [place] did not inherit a identifiable mutation in a cancer predisposition gene included on this panel, Arlean's children could not have inherited a known mutation from Rodneisha's in one of these genes. Individuals in this family might be at some increased risk of developing cancer, over the general population risk, simply due to the family history of cancer.  We recommended women in this family have a yearly mammogram beginning at age 68, or 29 years younger than the earliest onset of cancer, an annual clinical breast exam, and perform monthly breast self-exams. Women in this family should also have a gynecological exam as  recommended by their primary provider. All family members should be referred for colonoscopy starting at age 21, or 37 years younger than the earliest onset of cancer.  FOLLOW-UP: Lastly, we discussed with   Christopher that cancer genetics is a rapidly advancing field and it is possible that new genetic tests will be appropriate for [place] and/or Tiziana's family members in the future. We encouraged Chanda to remain in contact with cancer genetics on an annual basis so we can update Naja's personal and family histories and let [place] know of advances in cancer genetics that may benefit this family.   Our contact number was provided.   Ferreras's questions were answered to Cyrstal's satisfaction, and Khristy knows Jenness is welcome to call us at anytime with additional questions or concerns.   Maylon Cos, MS, [facility] Licensed, Certified Genetic Counselor [email or web address]

## 2023-03-24 ENCOUNTER — Other Ambulatory Visit: Payer: Self-pay | Admitting: Internal Medicine

## 2023-04-06 DIAGNOSIS — M3312 Other dermatopolymyositis with myopathy: Secondary | ICD-10-CM | POA: Diagnosis not present

## 2023-04-06 DIAGNOSIS — R519 Headache, unspecified: Secondary | ICD-10-CM | POA: Diagnosis not present

## 2023-04-06 DIAGNOSIS — R112 Nausea with vomiting, unspecified: Secondary | ICD-10-CM | POA: Diagnosis not present

## 2023-04-06 DIAGNOSIS — B349 Viral infection, unspecified: Secondary | ICD-10-CM | POA: Diagnosis not present

## 2023-04-14 ENCOUNTER — Other Ambulatory Visit: Payer: Self-pay | Admitting: Internal Medicine

## 2023-04-14 ENCOUNTER — Other Ambulatory Visit: Payer: Self-pay

## 2023-04-14 DIAGNOSIS — F332 Major depressive disorder, recurrent severe without psychotic features: Secondary | ICD-10-CM | POA: Diagnosis not present

## 2023-04-14 DIAGNOSIS — F411 Generalized anxiety disorder: Secondary | ICD-10-CM | POA: Diagnosis not present

## 2023-04-14 DIAGNOSIS — R112 Nausea with vomiting, unspecified: Secondary | ICD-10-CM | POA: Diagnosis not present

## 2023-04-14 DIAGNOSIS — M3312 Other dermatopolymyositis with myopathy: Secondary | ICD-10-CM | POA: Diagnosis not present

## 2023-04-14 MED ORDER — ONDANSETRON HCL 4 MG PO TABS: ORAL_TABLET | ORAL | 0 refills | Status: DC

## 2023-04-14 NOTE — Telephone Encounter (Signed)
 Adjusted prescription to a 30 day supply since patient has increased frequency of medication.

## 2023-04-17 DIAGNOSIS — G4733 Obstructive sleep apnea (adult) (pediatric): Secondary | ICD-10-CM | POA: Diagnosis not present

## 2023-04-18 DIAGNOSIS — M3320 Polymyositis, organ involvement unspecified: Secondary | ICD-10-CM | POA: Diagnosis not present

## 2023-04-18 DIAGNOSIS — Z79899 Other long term (current) drug therapy: Secondary | ICD-10-CM | POA: Diagnosis not present

## 2023-04-18 DIAGNOSIS — Z85820 Personal history of malignant melanoma of skin: Secondary | ICD-10-CM | POA: Diagnosis not present

## 2023-04-18 DIAGNOSIS — U099 Post covid-19 condition, unspecified: Secondary | ICD-10-CM | POA: Diagnosis not present

## 2023-04-18 DIAGNOSIS — I7389 Other specified peripheral vascular diseases: Secondary | ICD-10-CM | POA: Diagnosis not present

## 2023-04-18 DIAGNOSIS — I890 Lymphedema, not elsewhere classified: Secondary | ICD-10-CM | POA: Diagnosis not present

## 2023-04-18 DIAGNOSIS — E8989 Other postprocedural endocrine and metabolic complications and disorders: Secondary | ICD-10-CM | POA: Diagnosis not present

## 2023-04-18 DIAGNOSIS — M3300 Juvenile dermatopolymyositis, organ involvement unspecified: Secondary | ICD-10-CM | POA: Diagnosis not present

## 2023-04-23 ENCOUNTER — Encounter: Payer: Self-pay | Admitting: Internal Medicine

## 2023-04-25 NOTE — Telephone Encounter (Signed)
 Pt was contacted in regard to recent my chart message that was sent in. Pt stated that she has been dealing with nausea for about a year now. Pt stated that she felt that it had gotten better for a while but since March 20  it has been pretty severe and continuous. Pt stated that she recently seen her PCP and she is now on Compazine and Zofran 8 mg. Pt stated that her PCP stated that she may be dealing with gastroparesis and suggested she inquire about a stomach emptying test and if the results of that test would help direct therapies(medications and diet).  Pt does have an office visit that was previously scheduled to see Santina Cull NP on 05/17/2023. Pt aware. Please review and advise

## 2023-04-25 NOTE — Telephone Encounter (Signed)
 Pt stated that she has been taking the Amitiza and she feels that it is helping move things although pt stated recently that her BMs have been small hard balls, Pt stated that for the last three days that her BM's have been diarrhea. Diarrhea ( twice a day) Please review and advise.

## 2023-05-04 ENCOUNTER — Encounter: Payer: Self-pay | Admitting: Gastroenterology

## 2023-05-04 ENCOUNTER — Ambulatory Visit (INDEPENDENT_AMBULATORY_CARE_PROVIDER_SITE_OTHER): Admitting: Gastroenterology

## 2023-05-04 VITALS — BP 126/70 | HR 78 | Ht 67.0 in | Wt 177.0 lb

## 2023-05-04 DIAGNOSIS — R110 Nausea: Secondary | ICD-10-CM | POA: Diagnosis not present

## 2023-05-04 DIAGNOSIS — R6881 Early satiety: Secondary | ICD-10-CM

## 2023-05-04 DIAGNOSIS — K5904 Chronic idiopathic constipation: Secondary | ICD-10-CM

## 2023-05-04 MED ORDER — LUBIPROSTONE 24 MCG PO CAPS: 24.0000 ug | ORAL_CAPSULE | Freq: Two times a day (BID) | ORAL | 3 refills | Status: DC

## 2023-05-04 NOTE — Progress Notes (Signed)
 I agree with the assessment and plan as outlined by Ms. May. Okay to see if GES is abnormal before deciding on EGD. Agree with increasing Amitiza  dosage.

## 2023-05-04 NOTE — Patient Instructions (Addendum)
 Amitiza  24 mcg po twice , take in the middle of your meals Zofran  8 mg two to three times daily as needed for nausea  You have been scheduled for an Gastric Emptying Scan at [facility] on 05/11/23. Your appointment time is 7:30am. Please arrive to admitting (at main entrance of the hospital) 30 minutes prior to your appointment time for registration purposes. Please make certain not to have anything to eat or drink after midnight prior to your test. In addition, if you have any metal in your body, have a pacemaker or defibrillator, please be sure to let your ordering physician know. This test typically takes 45 minutes to 1 hour to complete. Should you need to reschedule, please call [phone] to do so.  _______________________________________________________  If your blood pressure at your visit was 140/90 or greater, please contact your primary care physician to follow up on this.  _______________________________________________________  If you are age 66 or older, your body mass index should be between 23-30. Your Body mass index is 27.72 kg/m. If this is out of the aforementioned range listed, please consider follow up with your Primary Care Provider.  If you are age 69 or younger, your body mass index should be between 19-25. Your Body mass index is 27.72 kg/m. If this is out of the aformentioned range listed, please consider follow up with your Primary Care Provider.   ________________________________________________________  The [facility] providers would like to encourage you to use MYCHART to communicate with providers for non-urgent requests or questions.  Due to long hold times on the telephone, sending your provider a message by [facility] may be a faster and more efficient way to get a response.  Please allow 48 business hours for a response.  Please remember that this is for non-urgent requests.  _______________________________________________________  Thank you for  trusting me with your gastrointestinal care!   Dyanna Glasgow, NP

## 2023-05-04 NOTE — Progress Notes (Signed)
 Chief Complaint: Follow-up constipation and nausea Primary GI Doctor:Dr. 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 Rosaline Coma. The Amitiza  really worked well in getting her stools moving. After she started Amitiza , she was having more regular BMs. Then because she was having insurance difficulties with getting the Amitiza  approved, she tried to stop the Amitiza  and it has been okay. She has some pain in her lower abdomen right after she eats. When she needs to poop, she has gas discomfort. Once she has a BM, her ab pain resolved. Certain foods cause more bloating. She has tried the low FODMAP diet in the past, which did help. She doesn't want to get more severely constipated in the future. For her nausea, she is using Zofran  BID but wants to try weaning off the Zofran  if possible.   Patient's SIBO breath test (collected 01/16/2023) results showed a normal increase in hydrogen of 15 bpm (normal less than 20), a normal increase in methane of 1 ppm (normal less than 12 ppm), and an elevated increase in combined hydrogen and methane of 16 ppm (normal less than 15).  This could suggest that she has SIBO.   Interval History    Patient presents for worsening nausea and constipation. Patient finished anitbiotic for Sibo on Feb 27 and there was no change in my nausea or bowel movements.  Patient complains of continuous issues with severe nausea.  She was on Zofran  4 mg 3 times a day and had to  introduce Compazine because it was getting so bad.  Patient states everything she eats feels like a brick in her stomach and she has no appetite. Eats two snacks most days and feels full. She has lost 20lbs since November. Patient denies GERD or dysphagia. Nonsmoker. THC gummies at bedtime for sleep. Recently started  immunoglobulin subcut. In Feb.  For the constipation patient was taking Amitiza  8mcg twice daily and having   bowel movement every other day that consisted of small balls. Patient attempted low fodmap diet per Dr. Les Rao recommendations without improvement in symptoms.  Wt Readings from Last 3 Encounters:  05/04/23 177 lb (80.3 kg)  01/06/23 178 lb 1.6 oz (80.8 kg)  11/18/22 195 lb (88.5 kg)    Past Medical History:  Diagnosis Date   Anxiety    Asthma    Depression    Dermatomyositis (HCC)    Family history of breast cancer    Family history of melanoma    Long COVID    Melanoma (HCC)    Pneumonia    Substance abuse (HCC)    No drink since October    Past Surgical History:  Procedure Laterality Date   CYSTOSCOPY N/A 12/29/2020   Procedure: CYSTOSCOPY;  Surgeon: Artemisa Bile, MD;  Location: [facility] OR;  Service: Gynecology;  Laterality: N/A;   lymph node removal     melanoma removal  2008-2009   SKIN BIOPSY  2021   Between breast for psoriasis   THIGH / KNEE SOFT TISSUE BIOPSY Right 2000   TOTAL LAPAROSCOPIC HYSTERECTOMY WITH SALPINGECTOMY N/A 12/29/2020   Procedure: TOTAL LAPAROSCOPIC HYSTERECTOMY WITH BILATERAL SALPINGECTOMY;  Surgeon: Artemisa Bile, MD;  Location: [facility] OR;  Service: Gynecology;  Laterality: N/A;    Current Outpatient Medications  Medication Sig Dispense Refill   acetaminophen  (TYLENOL ) 325  MG tablet Take 650 mg by mouth every 6 (six) hours as needed for moderate pain.     baclofen (LIORESAL) 10 MG tablet Take 5-10 mg by mouth 3 (three) times daily as needed.     busPIRone (BUSPAR) 5 MG tablet Take 5 mg by mouth in the morning, at noon, in the evening, and at bedtime. Taking 2 in the am and 2 in the pm     Calcium Carbonate-Vit D-Min (CALCIUM 600+D3 PLUS MINERALS) 600-800 MG-UNIT TABS Take 2 tablets by mouth 2 (two) times daily.     cetirizine (ZYRTEC) 10 MG tablet Take 10 mg by mouth daily. Pt Half of tablet 0.5 mg     clonazePAM  (KLONOPIN ) 0.5 MG tablet Take  0.5 mg by mouth 2 (two) times daily as needed for anxiety.     CUVITRU 1 GM/5ML SOLN Inject 1 g into the skin daily.     famotidine  (PEPCID ) 20 MG tablet Take 20 mg by mouth as needed.     gabapentin  (NEURONTIN ) 100 MG capsule Take 100-200 mg by mouth 2 (two) times daily.     gabapentin  (NEURONTIN ) 400 MG capsule Take 400 mg by mouth daily.     ibuprofen  (ADVIL ) 600 MG tablet Take 1 tablet (600 mg total) by mouth every 6 (six) hours as needed for cramping or moderate pain. 60 tablet 2   ketoconazole (NIZORAL) 2 % shampoo Apply 1 Application topically 2 (two) times a week.     Lactobacillus (PROBIOTIC ACIDOPHILUS) CAPS Take 1 capsule by mouth daily. (Patient taking differently: Take 2 capsules by mouth daily.)     lubiprostone  (AMITIZA ) 24 MCG capsule Take 1 capsule (24 mcg total) by mouth 2 (two) times daily with a meal. 60 capsule 3   Multiple Vitamin (MULTIVITAMIN WITH MINERALS) TABS tablet Take 2 tablets by mouth daily.     mycophenolate  (CELLCEPT ) 500 MG tablet Take 1,500 mg by mouth 2 (two) times daily.     Naltrexone HCl, Pain, (NALTREX) 4.5 MG CAPS Take 6 mg by mouth daily.     ondansetron  (ZOFRAN ) 4 MG tablet Take 1 tablet every morning, then repeat every 8 hours as needed. (Patient taking differently: Take 8 mg by mouth 2 (two) times daily. Take 1 tablet every morning, then repeat every 8 hours as needed.) 90 tablet 0   predniSONE  (DELTASONE ) 5 MG tablet Take 4 mg by mouth daily.     prochlorperazine (COMPAZINE) 5 MG tablet Take 5 mg by mouth as needed.     riTUXimab  (RITUXAN  IV) Inject 1 Dose into the vein See admin instructions. Yearly     trimethoprim (TRIMPEX) 100 MG tablet Take 100 mg by mouth daily.     valACYclovir  (VALTREX ) 500 MG tablet Take 1,000 mg by mouth daily.     No current facility-administered medications for this visit.    Allergies as of 05/04/2023 - Review Complete 05/04/2023  Allergen Reaction Noted   Pantoprazole  Other (See Comments) 11/18/2022    Family  History  Problem Relation Age of Onset   Arthritis Mother    Hypertension Mother    Breast cancer Mother 78   Arthritis Father    Hypertension Brother    Melanoma Brother 63   Breast cancer Paternal Aunt 23       father's maternal half sister   Arthritis Maternal Grandmother    Stroke Maternal Grandmother    Arthritis Maternal Grandfather    Melanoma Maternal Grandfather    Diabetes Paternal Grandmother    Breast cancer  Paternal Grandmother 41   Heart disease Paternal Grandfather    Colon cancer Neg Hx    Esophageal cancer Neg Hx     Review of Systems:    Constitutional: No weight loss, fever, chills, weakness or fatigue HEENT: Eyes: No change in vision               Ears, Nose, Throat:  No change in hearing or congestion Skin: No rash or itching Cardiovascular: No chest pain, chest pressure or palpitations   Respiratory: No SOB or cough Gastrointestinal: See HPI and otherwise negative Genitourinary: No dysuria or change in urinary frequency Neurological: No headache, dizziness or syncope Musculoskeletal: No new muscle or joint pain Hematologic: No bleeding or bruising Psychiatric: No history of depression or anxiety    Physical Exam:  Vital signs: BP 126/70 (Cuff Size: Normal)   Pulse 78   Ht 5\' 7"  (1.702 m)   Wt 177 lb (80.3 kg) Comment: per pt, wheelchair bound  LMP 12/24/2020   BMI 27.72 kg/m   Constitutional:   Pleasant  female appears to be in NAD, Well developed, Well nourished, alert and cooperative Head:  Normocephalic and atraumatic. Eyes:   PEERL, EOMI. No icterus. Conjunctiva pink. Ears:  Normal auditory acuity. Neck:  Supple Throat: Oral cavity and pharynx without inflammation, swelling or lesion.  Respiratory: Respirations even and unlabored. Lungs clear to auscultation bilaterally.   No wheezes, crackles, or rhonchi.  Cardiovascular: Normal S1, S2. Regular rate and rhythm. No peripheral edema, cyanosis or pallor.  Gastrointestinal:  Soft,  nondistended, nontender. No rebound or guarding. Hypoactive bowel sounds. No appreciable masses or hepatomegaly. Rectal:  Not performed.  Anoscopy: Msk:  Symmetrical without gross deformities. Without edema, no deformity or joint abnormality.  Neurologic:  Alert and  oriented x4;  grossly normal neurologically.  Skin:   Dry and intact without significant lesions or rashes. Psychiatric: Oriented to person, place and time. Demonstrates good judgement and reason without abnormal affect or behaviors.  RELEVANT LABS AND IMAGING: CBC    Latest Ref Rng & Units 12/30/2020    5:10 AM 12/25/2020    3:09 PM 01/22/2016   10:18 AM  CBC  WBC 4.0 - 10.5 K/uL 11.7  12.6  14.2   Hemoglobin 12.0 - 15.0 g/dL 9.8  72.5  36.6   Hematocrit 36.0 - 46.0 % 30.4  40.7  38.5   Platelets 150 - 400 K/uL 252  292  295.0      CMP     Latest Ref Rng & Units 12/25/2020    3:09 PM 01/22/2016   10:18 AM  CMP  Glucose 70 - 99 mg/dL 440  86   BUN 6 - 20 mg/dL 8  10   Creatinine 3.47 - 1.00 mg/dL 4.25  9.56   Sodium 387 - 145 mmol/L 139  140   Potassium 3.5 - 5.1 mmol/L 3.5  3.5   Chloride 98 - 111 mmol/L 106  103   CO2 22 - 32 mmol/L 25  29   Calcium 8.9 - 10.3 mg/dL 9.0  9.7   Total Protein 6.0 - 8.3 g/dL  7.2   Total Bilirubin 0.2 - 1.2 mg/dL  0.5   Alkaline Phos 39 - 117 U/L  33   AST 0 - 37 U/L  19   ALT 0 - 35 U/L  22      Lab Results  Component Value Date   TSH 2.590 01/09/2019  Labs 07/2022: CBC unremarkable. K mildly low at 3.3.  LFTs normal.  Magnesium normal.  Vitamin D  normal.  TSH normal.  Ferritin normal.  CRP elevated at 4.14.  ESR normal.    Patient's SIBO breath test (collected 01/16/2023) results showed a normal increase in hydrogen of 15 bpm (normal less than 20), a normal increase in methane of 1 ppm (normal less than 12 ppm), and an elevated increase in combined hydrogen and methane of 16 ppm (normal less than 15).  This could suggest that she has SIBO.  11/23/2022 Cologuard  negative 08/18/2022 abdominal x-ray-showed moderate constipation 06/04/2022 echo- Left ventricular ejection fraction, by estimation, is 50 to 55%.   Assessment: Encounter Diagnoses  Name Primary?   Chronic idiopathic constipation Yes   Nausea without vomiting    Early satiety      38 year old patient with history of DM which can also affect various organs, including blood vessels, lungs, esophagus, joints, and (though rarely) the lower gastrointestinal tract, impacting colon motility.  Patient has had continuous issues with chronic constipation not relieved with Amitiza  8 mcg twice daily and MiraLAX.  We discussed increasing the dose of Amitiza  as her insurance does not cover other pro secretary agents such as Linzess  or Trulance .  For the nausea, poor appetite, full satiety, and weight loss I will order 4 hour gastric emptying study to rule out motility problem, if this is negative patient would like to pursue upper endoscopy. No known hx of DM.  She is taking schedule zofran  8 mg twice daily. A pro motility agent like Motegrity Shanyiah Conde be beneficial if she has delayed gastric emptying in addition to the constipation. She is not consuming a lot of calories and consuming not adequate amount of calories per day with two small snacks.  I would like to get her bowels more regular before considering any invasive procedures for better outcome.   Plan: - Order 4 hr gastric emptying scan - Continue Zofran  8 mg two to three times daily -Increase Amitiza  from 8 mcg to 24 mcg - others not covered by insurance.  -defer EGD/colon to Dr. Rosaline Coma  Thank you for the courtesy of this consult. Please call me with any questions or concerns.   Matti Killingsworth, FNP-C [facility] 05/04/2023, 2:32 PM  Cc: Allene Ivan, MD

## 2023-05-11 ENCOUNTER — Ambulatory Visit (HOSPITAL_COMMUNITY)
Admission: RE | Admit: 2023-05-11 | Discharge: 2023-05-11 | Disposition: A | Discharge status: Home / Self Care | Source: Ambulatory Visit | Attending: Gastroenterology | Admitting: Gastroenterology

## 2023-05-11 DIAGNOSIS — R110 Nausea: Secondary | ICD-10-CM | POA: Insufficient documentation

## 2023-05-11 DIAGNOSIS — R6881 Early satiety: Secondary | ICD-10-CM | POA: Diagnosis not present

## 2023-05-11 DIAGNOSIS — R112 Nausea with vomiting, unspecified: Secondary | ICD-10-CM | POA: Diagnosis not present

## 2023-05-11 DIAGNOSIS — K5904 Chronic idiopathic constipation: Secondary | ICD-10-CM | POA: Diagnosis not present

## 2023-05-11 MED ORDER — TECHNETIUM TC 99M SULFUR COLLOID
2.0000 | Freq: Once | INTRAVENOUS | Status: AC | PRN
  Administered 2023-05-11: 2 via INTRAVENOUS

## 2023-05-12 ENCOUNTER — Other Ambulatory Visit: Payer: Self-pay

## 2023-05-12 DIAGNOSIS — R194 Change in bowel habit: Secondary | ICD-10-CM

## 2023-05-12 DIAGNOSIS — R6881 Early satiety: Secondary | ICD-10-CM

## 2023-05-12 DIAGNOSIS — R110 Nausea: Secondary | ICD-10-CM

## 2023-05-12 DIAGNOSIS — R630 Anorexia: Secondary | ICD-10-CM

## 2023-05-12 DIAGNOSIS — R634 Abnormal weight loss: Secondary | ICD-10-CM

## 2023-05-12 MED ORDER — NA SULFATE-K SULFATE-MG SULF 17.5-3.13-1.6 GM/177ML PO SOLN: 1.0000 | Freq: Once | ORAL | 0 refills | Status: AC

## 2023-05-12 NOTE — Telephone Encounter (Signed)
 Left message for patient to call back

## 2023-05-17 ENCOUNTER — Ambulatory Visit: Payer: BC Managed Care – PPO | Admitting: Physician Assistant

## 2023-05-19 ENCOUNTER — Ambulatory Visit (HOSPITAL_COMMUNITY)
Admission: RE | Admit: 2023-05-19 | Discharge: 2023-05-19 | Disposition: A | Discharge status: Home / Self Care | Source: Ambulatory Visit | Attending: Gastroenterology | Admitting: Gastroenterology

## 2023-05-19 DIAGNOSIS — R110 Nausea: Secondary | ICD-10-CM

## 2023-05-19 DIAGNOSIS — R634 Abnormal weight loss: Secondary | ICD-10-CM | POA: Diagnosis not present

## 2023-05-19 DIAGNOSIS — R6881 Early satiety: Secondary | ICD-10-CM | POA: Diagnosis not present

## 2023-05-19 DIAGNOSIS — R630 Anorexia: Secondary | ICD-10-CM

## 2023-05-19 DIAGNOSIS — R194 Change in bowel habit: Secondary | ICD-10-CM | POA: Diagnosis not present

## 2023-05-26 DIAGNOSIS — R5382 Chronic fatigue, unspecified: Secondary | ICD-10-CM | POA: Diagnosis not present

## 2023-05-26 DIAGNOSIS — M3313 Other dermatomyositis without myopathy: Secondary | ICD-10-CM | POA: Diagnosis not present

## 2023-05-26 DIAGNOSIS — U099 Post covid-19 condition, unspecified: Secondary | ICD-10-CM | POA: Diagnosis not present

## 2023-05-26 DIAGNOSIS — R197 Diarrhea, unspecified: Secondary | ICD-10-CM | POA: Diagnosis not present

## 2023-05-26 DIAGNOSIS — R6881 Early satiety: Secondary | ICD-10-CM | POA: Diagnosis not present

## 2023-05-26 DIAGNOSIS — R5381 Other malaise: Secondary | ICD-10-CM | POA: Diagnosis not present

## 2023-05-26 DIAGNOSIS — R110 Nausea: Secondary | ICD-10-CM | POA: Diagnosis not present

## 2023-05-26 DIAGNOSIS — K5909 Other constipation: Secondary | ICD-10-CM | POA: Diagnosis not present

## 2023-05-30 ENCOUNTER — Ambulatory Visit: Admitting: Gastroenterology

## 2023-05-30 DIAGNOSIS — G4733 Obstructive sleep apnea (adult) (pediatric): Secondary | ICD-10-CM | POA: Diagnosis not present

## 2023-06-14 DIAGNOSIS — D806 Antibody deficiency with near-normal immunoglobulins or with hyperimmunoglobulinemia: Secondary | ICD-10-CM | POA: Diagnosis not present

## 2023-06-14 DIAGNOSIS — D830 Common variable immunodeficiency with predominant abnormalities of B-cell numbers and function: Secondary | ICD-10-CM | POA: Diagnosis not present

## 2023-06-20 ENCOUNTER — Encounter: Payer: Self-pay | Admitting: Internal Medicine

## 2023-06-20 ENCOUNTER — Ambulatory Visit: Admitting: Internal Medicine

## 2023-06-20 VITALS — BP 112/58 | HR 89 | Temp 98.1°F | Resp 11 | Ht 67.0 in | Wt 177.0 lb

## 2023-06-20 DIAGNOSIS — R194 Change in bowel habit: Secondary | ICD-10-CM

## 2023-06-20 DIAGNOSIS — R110 Nausea: Secondary | ICD-10-CM

## 2023-06-20 DIAGNOSIS — R630 Anorexia: Secondary | ICD-10-CM | POA: Diagnosis not present

## 2023-06-20 DIAGNOSIS — R634 Abnormal weight loss: Secondary | ICD-10-CM

## 2023-06-20 DIAGNOSIS — K319 Disease of stomach and duodenum, unspecified: Secondary | ICD-10-CM | POA: Diagnosis not present

## 2023-06-20 DIAGNOSIS — K648 Other hemorrhoids: Secondary | ICD-10-CM

## 2023-06-20 DIAGNOSIS — F419 Anxiety disorder, unspecified: Secondary | ICD-10-CM | POA: Diagnosis not present

## 2023-06-20 DIAGNOSIS — K2289 Other specified disease of esophagus: Secondary | ICD-10-CM

## 2023-06-20 DIAGNOSIS — F32A Depression, unspecified: Secondary | ICD-10-CM | POA: Diagnosis not present

## 2023-06-20 MED ORDER — ESOMEPRAZOLE MAGNESIUM 40 MG PO CPDR: 40.0000 mg | DELAYED_RELEASE_CAPSULE | Freq: Every day | ORAL | 2 refills | Status: DC

## 2023-06-20 MED ORDER — SODIUM CHLORIDE 0.9 % IV SOLN: 500.0000 mL | Freq: Once | INTRAVENOUS | Status: DC

## 2023-06-20 NOTE — Progress Notes (Signed)
 Called to room to assist during endoscopic procedure.  Patient ID and intended procedure confirmed with present staff. Received instructions for my participation in the procedure from the performing physician.

## 2023-06-20 NOTE — Progress Notes (Signed)
 Sedate, gd SR, tolerated procedure well, VSS, report to RN

## 2023-06-20 NOTE — Progress Notes (Signed)
 Nexium 40 mg po daily, #30, 2 refills VO Dr. Mickle Albe RN

## 2023-06-20 NOTE — Progress Notes (Signed)
 GASTROENTEROLOGY PROCEDURE H&P NOTE   Primary Care Physician: Allene Ivan, MD    Reason for Procedure:   Nausea, poor appetite, weight loss  Plan:    EGD/colonoscopy  Patient is appropriate for endoscopic procedure(s) in the ambulatory ([facility]) setting.  The nature of the procedure, as well as the risks, benefits, and alternatives were carefully and thoroughly reviewed with the patient. Ample time for discussion and questions allowed. The patient understood, was satisfied, and agreed to proceed.     HPI: Patricia Blevins is a 38 y.o. adult who presents for EGD/colonoscopy for evaluation of nausea, poor appetite, weight loss.  Patient was most recently seen in the Gastroenterology Clinic on 05/04/23.  No interval change in medical history since that appointment. Her gastric emptying study was normal. RUQ U/S was normal. Please refer to that note for full details regarding GI history and clinical presentation.   Past Medical History:  Diagnosis Date   Anxiety    Asthma    Depression    Dermatomyositis (HCC)    Family history of breast cancer    Family history of melanoma    Long COVID    Melanoma (HCC)    Pneumonia    Substance abuse (HCC)    No drink since October    Past Surgical History:  Procedure Laterality Date   CYSTOSCOPY N/A 12/29/2020   Procedure: CYSTOSCOPY;  Surgeon: Artemisa Bile, MD;  Location: [facility] OR;  Service: Gynecology;  Laterality: N/A;   lymph node removal     melanoma removal  2008-2009   SKIN BIOPSY  2021   Between breast for psoriasis   THIGH / KNEE SOFT TISSUE BIOPSY Right 2000   TOTAL LAPAROSCOPIC HYSTERECTOMY WITH SALPINGECTOMY N/A 12/29/2020   Procedure: TOTAL LAPAROSCOPIC HYSTERECTOMY WITH BILATERAL SALPINGECTOMY;  Surgeon: Artemisa Bile, MD;  Location: [facility] OR;  Service: Gynecology;  Laterality: N/A;    Prior to Admission medications   Medication Sig Start Date End Date Taking? Authorizing Provider  acetaminophen  (TYLENOL ) 325 MG tablet Take  650 mg by mouth every 6 (six) hours as needed for moderate pain.    [provider]  baclofen (LIORESAL) 10 MG tablet Take 5-10 mg by mouth 3 (three) times daily as needed. 08/26/22   [provider]  busPIRone (BUSPAR) 5 MG tablet Take 5 mg by mouth in the morning, at noon, in the evening, and at bedtime. Taking 2 in the am and 2 in the pm 07/25/22   [provider]  Calcium Carbonate-Vit D-Min (CALCIUM 600+D3 PLUS MINERALS) 600-800 MG-UNIT TABS Take 2 tablets by mouth 2 (two) times daily.    [provider]  cetirizine (ZYRTEC) 10 MG tablet Take 10 mg by mouth daily. Pt Half of tablet 0.5 mg    [provider]  clonazePAM  (KLONOPIN ) 0.5 MG tablet Take 0.5 mg by mouth 2 (two) times daily as needed for anxiety.    [provider]  CUVITRU 1 GM/5ML SOLN Inject 1 g into the skin daily. 12/15/22   [provider]  famotidine  (PEPCID ) 20 MG tablet Take 20 mg by mouth as needed.    [provider]  gabapentin  (NEURONTIN ) 100 MG capsule Take 100-200 mg by mouth 2 (two) times daily. 07/30/22   [provider]  gabapentin  (NEURONTIN ) 400 MG capsule Take 400 mg by mouth daily. 07/29/22   [provider]  ibuprofen  (ADVIL ) 600 MG tablet Take 1 tablet (600 mg total) by mouth every 6 (six) hours as needed for cramping  or moderate pain. 12/30/20   Pinn, Walda, MD  ketoconazole (NIZORAL) 2 % shampoo Apply 1 Application topically 2 (two) times a week. 08/20/22   [provider]  Lactobacillus (PROBIOTIC ACIDOPHILUS) CAPS Take 1 capsule by mouth daily. Patient taking differently: Take 2 capsules by mouth daily. 01/22/16   Colene Dauphin, MD  lubiprostone  (AMITIZA ) 24 MCG capsule Take 1 capsule (24 mcg total) by mouth 2 (two) times daily with a meal. 05/04/23   May, Deanna J, NP  Multiple Vitamin (MULTIVITAMIN WITH MINERALS) TABS tablet Take 2 tablets by mouth daily.    [provider]  mycophenolate  (CELLCEPT ) 500 MG  tablet Take 1,500 mg by mouth 2 (two) times daily.    [provider]  Naltrexone HCl, Pain, (NALTREX) 4.5 MG CAPS Take 6 mg by mouth daily.    [provider]  ondansetron  (ZOFRAN ) 4 MG tablet Take 1 tablet every morning, then repeat every 8 hours as needed. Patient taking differently: Take 8 mg by mouth 2 (two) times daily. Take 1 tablet every morning, then repeat every 8 hours as needed. 04/14/23   Daina Drum, MD  predniSONE  (DELTASONE ) 5 MG tablet Take 4 mg by mouth daily.    [provider]  prochlorperazine (COMPAZINE) 5 MG tablet Take 5 mg by mouth as needed. 04/14/23   [provider]  riTUXimab  (RITUXAN  IV) Inject 1 Dose into the vein See admin instructions. Yearly    [provider]  trimethoprim (TRIMPEX) 100 MG tablet Take 100 mg by mouth daily. 08/11/22   [provider]  valACYclovir  (VALTREX ) 500 MG tablet Take 1,000 mg by mouth daily.    [provider]  fluticasone  (FLONASE ) 50 MCG/ACT nasal spray Place 2 sprays into both nostrils daily. Patient not taking: Reported on 10/13/2016 05/18/16 09/20/18  Stallings, Zoe A, MD  norethindrone  (ORTHO MICRONOR ) 0.35 MG tablet Take 1 tablet (0.35 mg total) by mouth daily. Patient not taking: Reported on 05/18/2016 02/25/16 09/20/18  Stallings, Zoe A, MD    Current Outpatient Medications  Medication Sig Dispense Refill   acetaminophen  (TYLENOL ) 325 MG tablet Take 650 mg by mouth every 6 (six) hours as needed for moderate pain.     baclofen (LIORESAL) 10 MG tablet Take 5-10 mg by mouth 3 (three) times daily as needed.     busPIRone (BUSPAR) 5 MG tablet Take 5 mg by mouth in the morning, at noon, in the evening, and at bedtime. Taking 2 in the am and 2 in the pm     Calcium Carbonate-Vit D-Min (CALCIUM 600+D3 PLUS MINERALS) 600-800 MG-UNIT TABS Take 2 tablets by mouth 2 (two) times daily.     cetirizine (ZYRTEC) 10 MG tablet Take 10 mg by mouth daily. Pt Half of tablet 0.5 mg     clonazePAM   (KLONOPIN ) 0.5 MG tablet Take 0.5 mg by mouth 2 (two) times daily as needed for anxiety.     CUVITRU 1 GM/5ML SOLN Inject 1 g into the skin daily.     famotidine  (PEPCID ) 20 MG tablet Take 20 mg by mouth as needed.     gabapentin  (NEURONTIN ) 100 MG capsule Take 100-200 mg by mouth 2 (two) times daily.     gabapentin  (NEURONTIN ) 400 MG capsule Take 400 mg by mouth daily.     ibuprofen  (ADVIL ) 600 MG tablet Take 1 tablet (600 mg total) by mouth every 6 (six) hours as needed for cramping or moderate pain. 60 tablet 2   ketoconazole (NIZORAL) 2 %  shampoo Apply 1 Application topically 2 (two) times a week.     Lactobacillus (PROBIOTIC ACIDOPHILUS) CAPS Take 1 capsule by mouth daily. (Patient taking differently: Take 2 capsules by mouth daily.)     lubiprostone  (AMITIZA ) 24 MCG capsule Take 1 capsule (24 mcg total) by mouth 2 (two) times daily with a meal. 60 capsule 3   Multiple Vitamin (MULTIVITAMIN WITH MINERALS) TABS tablet Take 2 tablets by mouth daily.     mycophenolate  (CELLCEPT ) 500 MG tablet Take 1,500 mg by mouth 2 (two) times daily.     Naltrexone HCl, Pain, (NALTREX) 4.5 MG CAPS Take 6 mg by mouth daily.     ondansetron  (ZOFRAN ) 4 MG tablet Take 1 tablet every morning, then repeat every 8 hours as needed. (Patient taking differently: Take 8 mg by mouth 2 (two) times daily. Take 1 tablet every morning, then repeat every 8 hours as needed.) 90 tablet 0   predniSONE  (DELTASONE ) 5 MG tablet Take 4 mg by mouth daily.     prochlorperazine (COMPAZINE) 5 MG tablet Take 5 mg by mouth as needed.     riTUXimab  (RITUXAN  IV) Inject 1 Dose into the vein See admin instructions. Yearly     trimethoprim (TRIMPEX) 100 MG tablet Take 100 mg by mouth daily.     valACYclovir  (VALTREX ) 500 MG tablet Take 1,000 mg by mouth daily.     Current Facility-Administered Medications  Medication Dose Route Frequency Provider Last Rate Last Admin   0.9 %  sodium chloride  infusion  500 mL Intravenous Once Daina Drum,  MD        Allergies as of 06/20/2023 - Review Complete 06/20/2023  Allergen Reaction Noted   Pantoprazole  Other (See Comments) 11/18/2022    Family History  Problem Relation Age of Onset   Arthritis Mother    Hypertension Mother    Breast cancer Mother 50   Arthritis Father    Hypertension Brother    Melanoma Brother 61   Breast cancer Paternal Aunt 34       father's maternal half sister   Arthritis Maternal Grandmother    Stroke Maternal Grandmother    Arthritis Maternal Grandfather    Melanoma Maternal Grandfather    Diabetes Paternal Grandmother    Breast cancer Paternal Grandmother 41   Heart disease Paternal Grandfather    Colon cancer Neg Hx    Esophageal cancer Neg Hx     Social History   Socioeconomic History   Marital status: Single    Spouse name: Not on file   Number of children: 0   Years of education: Not on file   Highest education level: Not on file  Occupational History   Not on file  Tobacco Use   Smoking status: Never   Smokeless tobacco: Never  Vaping Use   Vaping status: Never Used  Substance and Sexual Activity   Alcohol use: Not Currently   Drug use: Yes    Types: Marijuana    Comment: THC gummies   Sexual activity: Not on file  Other Topics Concern   Not on file  Social History Narrative   Not on file   Social Drivers of Health   Financial Resource Strain: Not on file  Food Insecurity: Not on file  Transportation Needs: Not on file  Physical Activity: Not on file  Stress: Not on file  Social Connections: Not on file  Intimate Partner Violence: Not on file    Physical Exam: Vital signs in last 24 hours: LMP  12/24/2020  GEN: NAD EYE: Sclerae anicteric ENT: MMM CV: Non-tachycardic Pulm: No increased WOB GI: Soft NEURO:  Alert & Oriented   Regino Caprio, MD [facility]   06/20/2023 1:44 PM

## 2023-06-20 NOTE — Op Note (Addendum)
 [facility] Patient Name: Patricia Blevins Procedure Date: 06/20/2023 1:38 PM MRN: [id number] Endoscopist: Pedro Bourgeois , , [id number] Age: 38 Referring MD:  Date of Birth: 06-08-1985 Gender: Female Account #: [id number] Procedure:                Colonoscopy Indications:              Diarrhea, Constipation, Weight loss Medicines:                Monitored Anesthesia Care Procedure:                Pre-Anesthesia Assessment:                           - Prior to the procedure, a History and Physical                            was performed, and patient medications and                            allergies were reviewed. The patient's tolerance of                            previous anesthesia was also reviewed. The risks                            and benefits of the procedure and the sedation                            options and risks were discussed with the patient.                            All questions were answered, and informed consent                            was obtained. Prior Anticoagulants: The patient has                            taken no anticoagulant or antiplatelet agents. ASA                            Grade Assessment: II - A patient with mild systemic                            disease. After reviewing the risks and benefits,                            the patient was deemed in satisfactory condition to                            undergo the procedure.                           After obtaining informed consent, the colonoscope  was passed under direct vision. Throughout the                            procedure, the patient's blood pressure, pulse, and                            oxygen saturations were monitored continuously. The                            Olympus Scope SN: [id number] was introduced through                            the anus and advanced to the the terminal ileum.                            The colonoscopy was  performed without difficulty.                            The patient tolerated the procedure well. The                            quality of the bowel preparation was adequate. The                            terminal ileum, ileocecal valve, appendiceal                            orifice, and rectum were photographed. Scope In: 2:23:10 PM Scope Out: 2:48:13 PM Scope Withdrawal Time: 0 hours 21 minutes 48 seconds  Total Procedure Duration: 0 hours 25 minutes 3 seconds  Findings:                 The terminal ileum appeared normal.                           Non-bleeding internal hemorrhoids were found during                            retroflexion.                           Biopsies for histology were taken with a cold                            forceps from the entire colon for evaluation of                            microscopic colitis. Complications:            No immediate complications. Estimated Blood Loss:     Estimated blood loss was minimal. Impression:               - The examined portion of the ileum was normal.                           - Non-bleeding internal hemorrhoids.                           -  Biopsies were taken with a cold forceps from the                            entire colon for evaluation of microscopic colitis. Recommendation:           - Discharge patient to home (with escort).                           - Await pathology results.                           - Continue Amitiza  therapy since I do suspect that                            you have underlying constipation.                           - The findings and recommendations were discussed                            with the patient. Dr Pedro Bourgeois "Anastacio Balm" Rosaline Coma,  06/20/2023 3:00:24 PM

## 2023-06-20 NOTE — Patient Instructions (Addendum)
 Await pathology results. Start Necium 40 mg daily. Return to GI clinic in 2-3 months. Continue Amitiza  therapy since I do suspect that you have underlying constipation.                             YOU HAD AN ENDOSCOPIC PROCEDURE TODAY AT THE [facility]:   Refer to the procedure report that was given to you for any specific questions about what was found during the examination.  If the procedure report does not answer your questions, please call your gastroenterologist to clarify.  If you requested that your care partner not be given the details of your procedure findings, then the procedure report has been included in a sealed envelope for you to review at your convenience later.  YOU SHOULD EXPECT: Some feelings of bloating in the abdomen. Passage of more gas than usual.  Walking can help get rid of the air that was put into your GI tract during the procedure and reduce the bloating. If you had a lower endoscopy (such as a colonoscopy or flexible sigmoidoscopy) you may notice spotting of blood in your stool or on the toilet paper. If you underwent a bowel prep for your procedure, you may not have a normal bowel movement for a few days.  Please Note:  You might notice some irritation and congestion in your nose or some drainage.  This is from the oxygen used during your procedure.  There is no need for concern and it should clear up in a day or so.  SYMPTOMS TO REPORT IMMEDIATELY:  Following lower endoscopy (colonoscopy or flexible sigmoidoscopy):  Excessive amounts of blood in the stool  Significant tenderness or worsening of abdominal pains  Swelling of the abdomen that is new, acute  Fever of 100F or higher  Following upper endoscopy (EGD)  Vomiting of blood or coffee ground material  New chest pain or pain under the shoulder blades  Painful or persistently difficult swallowing  New shortness of breath  Fever of 100F or higher  Black, tarry-looking stools  For urgent or  emergent issues, a gastroenterologist can be reached at any hour by calling [phone]. Do not use MyChart messaging for urgent concerns.    DIET:  We do recommend a small meal at first, but then you may proceed to your regular diet.  Drink plenty of fluids but you should avoid alcoholic beverages for 24 hours.  ACTIVITY:  You should plan to take it easy for the rest of today and you should NOT DRIVE or use heavy machinery until tomorrow (because of the sedation medicines used during the test).    FOLLOW UP: Our staff will call the number listed on your records the next business day following your procedure.  We will call around 7:15- 8:00 am to check on you and address any questions or concerns that you may have regarding the information given to you following your procedure. If we do not reach you, we will leave a message.     If any biopsies were taken you will be contacted by phone or by letter within the next 1-3 weeks.  Please call us  at [phone] if you have not heard about the biopsies in 3 weeks.    SIGNATURES/CONFIDENTIALITY: You and/or your care partner have signed paperwork which will be entered into your electronic medical record.  These signatures attest to the fact that that the information above on your After  Visit Summary has been reviewed and is understood.  Full responsibility of the confidentiality of this discharge information lies with you and/or your care-partner.

## 2023-06-20 NOTE — Op Note (Addendum)
 [facility] Patient Name: Patricia Blevins Procedure Date: 06/20/2023 1:45 PM MRN: [id number] Endoscopist: Pedro Bourgeois , , [id number] Age: 38 Referring MD:  Date of Birth: 1985/05/31 Gender: Female Account #: [id number] Procedure:                Upper GI endoscopy Indications:              Nausea, Weight loss Medicines:                Monitored Anesthesia Care Procedure:                Pre-Anesthesia Assessment:                           - Prior to the procedure, a History and Physical                            was performed, and patient medications and                            allergies were reviewed. The patient's tolerance of                            previous anesthesia was also reviewed. The risks                            and benefits of the procedure and the sedation                            options and risks were discussed with the patient.                            All questions were answered, and informed consent                            was obtained. Prior Anticoagulants: The patient has                            taken no anticoagulant or antiplatelet agents. ASA                            Grade Assessment: II - A patient with mild systemic                            disease. After reviewing the risks and benefits,                            the patient was deemed in satisfactory condition to                            undergo the procedure.                           After obtaining informed consent, the endoscope was  passed under direct vision. Throughout the                            procedure, the patient's blood pressure, pulse, and                            oxygen saturations were monitored continuously. The                            GIF [id number] #[id number] was introduced through the                            mouth, and advanced to the second part of duodenum.                            The upper GI endoscopy was  accomplished without                            difficulty. The patient tolerated the procedure                            well. Scope In: Scope Out: Findings:                 A single 6 mm polyp with no bleeding was found in                            the proximal esophagus. Biopsies were taken with a                            cold forceps for histology.                           Localized inflammation characterized by congestion                            (edema) and erythema was found in the gastric                            antrum. Biopsies were taken with a cold forceps for                            histology.                           The examined duodenum was normal. Biopsies for                            histology were taken with a cold forceps for                            evaluation of celiac disease. Complications:            No immediate complications. Estimated Blood Loss:     Estimated blood loss was minimal. Impression:               -  Esophageal polyp(s) were found. Biopsied.                           - Gastritis. Biopsied.                           - Normal examined duodenum. Biopsied. Recommendation:           - Await pathology results.                           - Start Nexium 40 mg daily. (patient stated that                            pantoprazole  previously gave her abdominal pain)                           - Return to GI clinic in 2-3 months.                           - Perform a colonoscopy today. Dr Pedro Bourgeois "Anastacio Balm" Rosaline Coma,  06/20/2023 2:57:31 PM

## 2023-06-21 ENCOUNTER — Encounter: Payer: Self-pay | Admitting: Internal Medicine

## 2023-06-21 ENCOUNTER — Telehealth: Payer: Self-pay

## 2023-06-21 NOTE — Telephone Encounter (Signed)
 Left message

## 2023-06-23 ENCOUNTER — Other Ambulatory Visit: Payer: Self-pay

## 2023-06-23 ENCOUNTER — Encounter: Payer: Self-pay | Admitting: Internal Medicine

## 2023-06-23 ENCOUNTER — Ambulatory Visit: Payer: Self-pay | Admitting: Internal Medicine

## 2023-06-23 DIAGNOSIS — R718 Other abnormality of red blood cells: Secondary | ICD-10-CM

## 2023-06-23 LAB — SURGICAL PATHOLOGY

## 2023-06-28 ENCOUNTER — Ambulatory Visit: Payer: Self-pay | Admitting: Internal Medicine

## 2023-06-28 ENCOUNTER — Other Ambulatory Visit (INDEPENDENT_AMBULATORY_CARE_PROVIDER_SITE_OTHER)

## 2023-06-28 DIAGNOSIS — R718 Other abnormality of red blood cells: Secondary | ICD-10-CM

## 2023-06-28 LAB — VITAMIN B12: Vitamin B-12: 977 pg/mL — ABNORMAL HIGH (ref 211–911)

## 2023-06-28 LAB — FOLATE: Folate: >23.2 ng/mL (ref >5.9)

## 2023-06-29 DIAGNOSIS — M3312 Other dermatopolymyositis with myopathy: Secondary | ICD-10-CM | POA: Diagnosis not present

## 2023-06-29 DIAGNOSIS — C799 Secondary malignant neoplasm of unspecified site: Secondary | ICD-10-CM | POA: Diagnosis not present

## 2023-06-29 DIAGNOSIS — D830 Common variable immunodeficiency with predominant abnormalities of B-cell numbers and function: Secondary | ICD-10-CM | POA: Diagnosis not present

## 2023-06-29 DIAGNOSIS — F319 Bipolar disorder, unspecified: Secondary | ICD-10-CM | POA: Diagnosis not present

## 2023-07-08 DIAGNOSIS — D830 Common variable immunodeficiency with predominant abnormalities of B-cell numbers and function: Secondary | ICD-10-CM | POA: Diagnosis not present

## 2023-07-08 DIAGNOSIS — B999 Unspecified infectious disease: Secondary | ICD-10-CM | POA: Diagnosis not present

## 2023-07-08 DIAGNOSIS — M3313 Other dermatomyositis without myopathy: Secondary | ICD-10-CM | POA: Diagnosis not present

## 2023-07-08 DIAGNOSIS — U099 Post covid-19 condition, unspecified: Secondary | ICD-10-CM | POA: Diagnosis not present

## 2023-07-08 DIAGNOSIS — D806 Antibody deficiency with near-normal immunoglobulins or with hyperimmunoglobulinemia: Secondary | ICD-10-CM | POA: Diagnosis not present

## 2023-07-11 DIAGNOSIS — G729 Myopathy, unspecified: Secondary | ICD-10-CM | POA: Diagnosis not present

## 2023-07-11 DIAGNOSIS — M3300 Juvenile dermatopolymyositis, organ involvement unspecified: Secondary | ICD-10-CM | POA: Diagnosis not present

## 2023-07-20 DIAGNOSIS — M3300 Juvenile dermatopolymyositis, organ involvement unspecified: Secondary | ICD-10-CM | POA: Diagnosis not present

## 2023-07-22 DIAGNOSIS — M3312 Other dermatopolymyositis with myopathy: Secondary | ICD-10-CM | POA: Diagnosis not present

## 2023-07-22 DIAGNOSIS — M6281 Muscle weakness (generalized): Secondary | ICD-10-CM | POA: Diagnosis not present

## 2023-07-23 ENCOUNTER — Other Ambulatory Visit: Payer: Self-pay | Admitting: Gastroenterology

## 2023-07-23 DIAGNOSIS — K5904 Chronic idiopathic constipation: Secondary | ICD-10-CM

## 2023-07-29 DIAGNOSIS — M3313 Other dermatomyositis without myopathy: Secondary | ICD-10-CM | POA: Diagnosis not present

## 2023-07-29 DIAGNOSIS — M3300 Juvenile dermatopolymyositis, organ involvement unspecified: Secondary | ICD-10-CM | POA: Diagnosis not present

## 2023-07-29 DIAGNOSIS — R5381 Other malaise: Secondary | ICD-10-CM | POA: Diagnosis not present

## 2023-07-29 DIAGNOSIS — R262 Difficulty in walking, not elsewhere classified: Secondary | ICD-10-CM | POA: Diagnosis not present

## 2023-08-03 DIAGNOSIS — M3312 Other dermatopolymyositis with myopathy: Secondary | ICD-10-CM | POA: Diagnosis not present

## 2023-08-03 DIAGNOSIS — N3010 Interstitial cystitis (chronic) without hematuria: Secondary | ICD-10-CM | POA: Diagnosis not present

## 2023-08-03 DIAGNOSIS — F411 Generalized anxiety disorder: Secondary | ICD-10-CM | POA: Diagnosis not present

## 2023-08-03 DIAGNOSIS — F332 Major depressive disorder, recurrent severe without psychotic features: Secondary | ICD-10-CM | POA: Diagnosis not present

## 2023-08-17 DIAGNOSIS — R5381 Other malaise: Secondary | ICD-10-CM | POA: Diagnosis not present

## 2023-08-17 DIAGNOSIS — M6281 Muscle weakness (generalized): Secondary | ICD-10-CM | POA: Diagnosis not present

## 2023-08-17 DIAGNOSIS — R262 Difficulty in walking, not elsewhere classified: Secondary | ICD-10-CM | POA: Diagnosis not present

## 2023-08-17 DIAGNOSIS — M3313 Other dermatomyositis without myopathy: Secondary | ICD-10-CM | POA: Diagnosis not present

## 2023-08-17 DIAGNOSIS — Z7409 Other reduced mobility: Secondary | ICD-10-CM | POA: Diagnosis not present

## 2023-08-17 NOTE — Progress Notes (Signed)
 Department of Rehabilitation Services  AQUATIC PT DAILY TREATMENT NOTE  Patient: Patricia Blevins (Preferred name: Patricia Blevins), DOB 08/24/1985, MRN [id number]   Visit Date: 08/17/2023  Visit Number: 2  for current Episode of Care Clinic Location:  [facility] PHYSICAL/OCCUP. THERAPY [address] [place] [address] Dept: [phone] Loc: [phone]  Encounter Diagnoses:    ICD-10-CM  1. Muscle weakness  M62.81  2. 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 Difficulty walking  R26.2    The patient is not fearful of water.   The patient enters the pool deck area by power wheelchair. The patient enters the pool using: Ramp (rail and single handhold assist)   Patient does require assistance for entry.  Prior to the initial aquatic session, the patient was screened for precautions such as HTN, DM, WB restrictions, history of seizures, asthma, pregnancy, neurological conditions and/or fear of water.  No contraindications such as open wounds, nausea or vomiting, fever greater than 100 degrees F, diarrhea or bowel incontinence, viral, bacterial or fungal disease were reported nor observed. Presently, Patricia Blevins (Preferred name: Patricia Blevins) is cleared for aquatic exercise.  Patient returns to clinic with variance in POC due to: Therapy Pool appointment availability.     SUBJECTIVE   Subjective Report: Patricia Blevins reports they did not have any post-exertional malaise or fatigue after recent PT evaluation. Would like to address shoulder, core, and leg strength, and eventually plans to go to local aquatic center for independent aquatic exercise.  Global Rate of Change: GRoC: n/a  Pain report:   Pain Assessment Pain Assessment %%: 0-10 Pain Score %%:   5 Pain Type: Chronic pain Pain Loc: Generalized (shoulders, calves, back) Pain Descriptors: Sore Pain Frequency: Constant/continuous Pain Onset: On-going     Falls:  Has the patient fallen  since last visit: no  OBJECTIVE    Outcomes:      Vitals:  Vitals:   08/17/23 1100  BP: 134/79  Pulse: 73    TREATMENT   Aquatic therapy: x10 reps each exercise unless otherwise noted Exercises were performed at Chest level (~75% bodyweight supported) Body Part Treated: Upper extremities, Trunk, Lower extremities Trunk Exercises Performed: Shoulder horizontal abduction/adduction without resistance paddles, Other (Specify), Latissimus pulldowns without buoy Lower Extremity Exercise Performed: Walking, Hip flexion/extension, Hip abduction/adduction Walking Method: Forward, Backward, Sideways with support Sideways Walking With Support: Noodle Upper Extremity Exercise Performed: Shoulder flexion/extension, Shoulder scaption/scapular plane elevation, Internal/external rotation   Additional exercises: -closed chain shoulder AAROM (scaption, flexion, and horizontal circles) -standing spinal rotation stretch -standing marching -shoulder rolls -Shoulder ab/adduction performed with back supported against pool wall.  Require the use of upper extremity support during exercise? Yes: handrail and noodle Cues that were required: Posture, Core Activation, Scapular Contol/Stability, UE Alignment Level of supervision/assistance required:Entire session performed with direct 1:1 PT supervision/assistance  EDUCATION   Patient/Family education: The patient received education regarding PT plan of care and Home Exercises by verbal communication and demonstration, appeared to show willingness to learn and Return demonstration with independency.  ASSESSMENT   Patricia Blevins presents to initial aquatic PT visit this plan of care addressing muscle strength, pain, and mobility impairments related to dermatomyositis and long COVID. Benefits from cues for R scapular control during shoulder AAROM exercises to reduce overuse of upper trapezius. Patricia Blevins tolerated an abbreviated session today without report of  immediate excessive fatigue; will modify session length or intensity based on pt report next session. Increased time required for pool exit due to lower extremity fatigue,  especially when walking up ramp incline.   Aquatic therapy is most appropriate for Patricia Blevins at this time, in order to take advantage of the following:  Access  Aerobic effects  Balance and Safety  Cardiovascular effects  Edema reduction  Graded resistance  Graded weightbearing  Movement freedom  Muscular effort  Pain reduction  Proprioception  Quality of Life   Thermal effect  Well being  Goals modified or Achieved today? No:             PLAN   The plan of care was made with the patient and/or family/care partners.     Recommended Follow up: Continue 1:1 aquatic physical therapy   Overall PT Plan for this episode of care: Continue at a frequency of 1x/week  Aquatic Plan: Frequency: 1x per week (as scheduling/availability allows) Duration: 6 week Aquatic visit number: 1  Current POC (if applicable) through:   Last Progress note/Goal Update: Yes (07/29/2023 12:01 AM)  Billing Information: Visit Date: 08/17/2023 Date of Onset: 11/12/22 Visit Number: 2 Session Start:: 1010 Session Stop:: 1045 Total Time: 35 minutes        Aquatic Therapy CPT 97113: 35 minutes                    Note: Per the evaluating physical therapist's plan of care, if patient does not return for follow up visit(s) related to this episode of care, this note will serve as their discharge note from physical therapy.  If patient returns to clinic with variance in plan of care, then it may be attributable to one or more of the following factors: preferred clinician availability, appointment time request availability, therapy pool appointment availability, major holiday with clinic closure, care partner availability, patient transportation, conflicting medical appointment, inclement weather, patient illness, and/or scheduling  error.  ___________________________________________________                                 Clinical Notes on MyChart: Progress notes documented by your healthcare team will now be available on the MyChart portal.  We believe that patients should be a part of the healthcare team.  We encourage you to review notes after visits and in preparation for upcoming appointments.  This provides the opportunity to review recommendations as well as to prepare questions for your healthcare team to address during your next visit.  If you identify discrepancies in the documentation or have specific questions related to the notes, please bring them to your next scheduled visit to discuss with your Physical Therapist (PT).  With increased transparency, our hope is that we create more trust, better communication, more shared decision-making, and increased satisfaction. Please be aware that these notes will not be discussed over the phone or through My Chart messages.  They will be discussed only at your next office visit with your provider.

## 2023-08-19 ENCOUNTER — Encounter: Payer: Self-pay | Admitting: Internal Medicine

## 2023-08-19 ENCOUNTER — Ambulatory Visit: Admitting: Internal Medicine

## 2023-08-19 VITALS — BP 122/78 | HR 81 | Ht 67.0 in | Wt 170.0 lb

## 2023-08-19 DIAGNOSIS — R110 Nausea: Secondary | ICD-10-CM | POA: Diagnosis not present

## 2023-08-19 DIAGNOSIS — K219 Gastro-esophageal reflux disease without esophagitis: Secondary | ICD-10-CM | POA: Diagnosis not present

## 2023-08-19 DIAGNOSIS — K5900 Constipation, unspecified: Secondary | ICD-10-CM

## 2023-08-19 NOTE — Patient Instructions (Signed)
 Follow up in 6 months.  _______________________________________________________  If your blood pressure at your visit was 140/90 or greater, please contact your primary care physician to follow up on this.  _______________________________________________________  If you are age 39 or older, your body mass index should be between 23-30. Your Body mass index is 26.63 kg/m. If this is out of the aforementioned range listed, please consider follow up with your Primary Care Provider.  If you are age 27 or younger, your body mass index should be between 19-25. Your Body mass index is 26.63 kg/m. If this is out of the aformentioned range listed, please consider follow up with your Primary Care Provider.   ________________________________________________________  The [facility] providers would like to encourage you to use MYCHART to communicate with providers for non-urgent requests or questions.  Due to long hold times on the telephone, sending your provider a message by [facility] may be a faster and more efficient way to get a response.  Please allow 48 business hours for a response.  Please remember that this is for non-urgent requests.  _______________________________________________________  [facility] is using a team-based approach to care.  Your team is made up of your doctor and two to three APPS. Our APPS (Nurse Practitioners and Physician Assistants) work with your physician to ensure care continuity for you. They are fully qualified to address your health concerns and develop a treatment plan. They communicate directly with your gastroenterologist to care for you. Seeing the Advanced Practice Practitioners on your physician's team can help you by facilitating care more promptly, often allowing for earlier appointments, access to diagnostic testing, procedures, and other specialty referrals.    It was a pleasure to see you today!  Thank you for trusting me with your  gastrointestinal care!

## 2023-08-19 NOTE — Progress Notes (Signed)
 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  Low FODMAP diet also did not help with her symptoms at all.  Interval History: Nexium  seems to helping a lot. About a month after she started the Nexium , she started seeing significant benefit in her symptoms. She is currently taking Nexium  once a day in the morning. She is taking Zofran  TID, which seems to help with nausea control. She has occasional breakthrough nausea. When she pushes herself too hard, she feels like her nausea seems to be slightly worse. If she is late taking her Zofran , that seems cause some of the nausea to recur. Nausea is better overall though. She has a poor appetite for much of the day but has a better appetite in the morning. When she eats, she doesn't have ab pain and doesn't have early satiety anymore. Her stools are fairly regular. She is taking Amitiza  24 mcg BID, which seems to be effective for controlling her constipation.   Wt Readings from Last 3 Encounters:  08/19/23 170 lb (77.1 kg)  06/20/23 177 lb (80.3 kg)  05/04/23 177 lb (80.3 kg)    Past Medical History:  Diagnosis Date   Anxiety    Asthma    Depression    Dermatomyositis (HCC)    Family history of breast cancer    Family history of melanoma    Long COVID    Melanoma (HCC)    Pneumonia    Substance abuse (HCC)    No drink since October    Past Surgical History:  Procedure Laterality Date   CYSTOSCOPY N/A 12/29/2020   Procedure: CYSTOSCOPY;  Surgeon: Bettina Muskrat, MD;  Location: [facility] OR;  Service: Gynecology;  Laterality: N/A;   lymph node removal     melanoma removal  2008-2009   SKIN BIOPSY  2021    Between breast for psoriasis   THIGH / KNEE SOFT TISSUE BIOPSY Right 2000   TOTAL LAPAROSCOPIC HYSTERECTOMY WITH SALPINGECTOMY N/A 12/29/2020   Procedure: TOTAL LAPAROSCOPIC HYSTERECTOMY WITH BILATERAL SALPINGECTOMY;  Surgeon: Bettina Muskrat, MD;  Location: [facility] OR;  Service: Gynecology;  Laterality: N/A;    Current Outpatient Medications  Medication Sig Dispense Refill   acetaminophen  (TYLENOL ) 325 MG tablet Take 650 mg by mouth every 6 (six) hours as needed for moderate pain.     baclofen (LIORESAL) 10 MG tablet Take 5-10 mg by mouth 3 (three) times daily as needed.     busPIRone (BUSPAR) 5 MG tablet Take 5 mg by mouth in the morning, at noon, in the evening, and at bedtime. Taking 2 in the am and 2 in the pm     Calcium Carbonate-Vit D-Min (CALCIUM 600+D3 PLUS MINERALS) 600-800 MG-UNIT TABS Take 2 tablets by mouth 2 (two) times daily.     cetirizine (ZYRTEC) 10 MG tablet Take 10 mg by mouth daily. Pt Half of tablet 0.5 mg     clonazePAM  (KLONOPIN ) 0.5 MG tablet Take 0.5 mg by mouth 2 (two) times daily as needed for anxiety.     esomeprazole  (NEXIUM ) 40 MG capsule Take 1 capsule (40 mg total) by mouth daily at 12 noon. 30 capsule 2   famotidine  (PEPCID ) 20 MG tablet Take  20 mg by mouth as needed.     gabapentin  (NEURONTIN ) 100 MG capsule Take 100-200 mg by mouth 2 (two) times daily.     gabapentin  (NEURONTIN ) 400 MG capsule Take 400 mg by mouth daily.     ibuprofen  (ADVIL ) 600 MG tablet Take 1 tablet (600 mg total) by mouth every 6 (six) hours as needed for cramping or moderate pain. 60 tablet 2   ketoconazole (NIZORAL) 2 % shampoo Apply 1 Application topically 2 (two) times a week.     Lactobacillus (PROBIOTIC ACIDOPHILUS) CAPS Take 1 capsule by mouth daily. (Patient taking differently: Take 2 capsules by mouth daily.)     lubiprostone  (AMITIZA ) 24 MCG capsule TAKE 1 CAPSULE (24 MCG TOTAL) BY MOUTH 2 (TWO) TIMES DAILY WITH A MEAL. 180 capsule 1   Multiple Vitamin (MULTIVITAMIN WITH MINERALS)  TABS tablet Take 2 tablets by mouth daily.     mycophenolate  (CELLCEPT ) 500 MG tablet Take 1,500 mg by mouth 2 (two) times daily.     Naltrexone HCl, Pain, (NALTREX) 4.5 MG CAPS Take 6 mg by mouth daily.     predniSONE  (DELTASONE ) 5 MG tablet Take 4 mg by mouth daily.     prochlorperazine (COMPAZINE) 5 MG tablet Take 5 mg by mouth as needed.     riTUXimab  (RITUXAN  IV) Inject 1 Dose into the vein See admin instructions. Yearly     trimethoprim (TRIMPEX) 100 MG tablet Take 100 mg by mouth daily.     valACYclovir  (VALTREX ) 500 MG tablet Take 1,000 mg by mouth daily.     CUVITRU 1 GM/5ML SOLN Inject 1 g into the skin daily.     ondansetron  (ZOFRAN ) 4 MG tablet Take 1 tablet every morning, then repeat every 8 hours as needed. (Patient taking differently: Take 8 mg by mouth 2 (two) times daily. Take 1 tablet every morning, then repeat every 8 hours as needed.) 90 tablet 0   potassium chloride (KLOR-CON M) 10 MEQ tablet Take 10 mEq by mouth.     sulfamethoxazole-trimethoprim (BACTRIM DS) 800-160 MG tablet Take 1 tablet by mouth every 12 (twelve) hours.     No current facility-administered medications for this visit.    Allergies as of 08/19/2023 - Review Complete 08/19/2023  Allergen Reaction Noted   Pantoprazole  Other (See Comments) 11/18/2022    Family History  Problem Relation Age of Onset   Arthritis Mother    Hypertension Mother    Breast cancer Mother 67   Arthritis Father    Hypertension Brother    Melanoma Brother 22   Breast cancer Paternal Aunt 45       father's maternal half sister   Arthritis Maternal Grandmother    Stroke Maternal Grandmother    Arthritis Maternal Grandfather    Melanoma Maternal Grandfather    Diabetes Paternal Grandmother    Breast cancer Paternal Grandmother 26   Heart disease Paternal Grandfather    Colon cancer Neg Hx    Esophageal cancer Neg Hx      Physical Exam:  Vital signs: BP 122/78   Pulse 81   Ht 5' 7 (1.702 m)   Wt 170 lb (77.1 kg)    LMP 12/24/2020   BMI 26.63 kg/m   Constitutional:   Pleasant  female appears to be in NAD. Sitting in a wheelchair Head:  Normocephalic and atraumatic. Respiratory: Respirations even and unlabored. Lungs clear to auscultation bilaterally. Cardiovascular: Normal S1, S2. Regular rate and rhythm. Gastrointestinal:  Soft, nondistended, nontender. No rebound or guarding. Normal  bowel sounds Neurologic:  Alert and  oriented x4;  grossly normal neurologically.  Skin:   Dry and intact without significant lesions or rashes. Psychiatric: Oriented to person, place and time. Demonstrates good judgement and reason without abnormal affect or behaviors.  RELEVANT LABS AND IMAGING: CBC    Latest Ref Rng & Units 12/30/2020    5:10 AM 12/25/2020    3:09 PM 01/22/2016   10:18 AM  CBC  WBC 4.0 - 10.5 K/uL 11.7  12.6  14.2   Hemoglobin 12.0 - 15.0 g/dL 9.8  86.7  86.7   Hematocrit 36.0 - 46.0 % 30.4  40.7  38.5   Platelets 150 - 400 K/uL 252  292  295.0      CMP     Latest Ref Rng & Units 12/25/2020    3:09 PM 01/22/2016   10:18 AM  CMP  Glucose 70 - 99 mg/dL 893  86   BUN 6 - 20 mg/dL 8  10   Creatinine 9.55 - 1.00 mg/dL 9.50  9.56   Sodium 864 - 145 mmol/L 139  140   Potassium 3.5 - 5.1 mmol/L 3.5  3.5   Chloride 98 - 111 mmol/L 106  103   CO2 22 - 32 mmol/L 25  29   Calcium 8.9 - 10.3 mg/dL 9.0  9.7   Total Protein 6.0 - 8.3 g/dL  7.2   Total Bilirubin 0.2 - 1.2 mg/dL  0.5   Alkaline Phos 39 - 117 U/L  33   AST 0 - 37 U/L  19   ALT 0 - 35 U/L  22      Lab Results  Component Value Date   TSH 2.590 01/09/2019   Labs 07/2022: CBC unremarkable. K mildly low at 3.3.  LFTs normal.  Magnesium  normal.  Vitamin D  normal.  TSH normal.  Ferritin normal.  CRP elevated at 4.14.  ESR normal.    11/23/2022 Cologuard negative  Patient's SIBO breath test (collected 01/16/2023) results showed a normal increase in hydrogen of 15 bpm (normal less than 20), a normal increase in methane of 1 ppm (normal  less than 12 ppm), and an elevated increase in combined hydrogen and methane of 16 ppm (normal less than 15).  This could suggest that she has SIBO.   Labs 06/2023: CBC unremarkable. CMP with low potassium of 3.2. Vit B12 and folate unremarkable.  06/04/2022 echo- Left ventricular ejection fraction, by estimation, is 50 to 55%.   08/18/2022 abdominal x-ray-showed moderate constipation  Gastric emptying study 05/11/23: IMPRESSION: Normal gastric emptying study.  RUQ U/S 05/19/23: IMPRESSION: Normal right upper quadrant ultrasound.  EGD 06/20/23: - A single 6 mm polyp with no bleeding was found in the proximal esophagus. Biopsies were taken with a cold forceps for histology. - Localized inflammation characterized by congestion ( edema) and erythema was found in the gastric antrum. Biopsies were taken with a cold forceps for histology. - The examined duodenum was normal. Biopsies for histology were taken with a cold forceps for evaluation of celiac disease. Path: 1. Surgical [P], duodenal :       -  DUODENAL MUCOSA WITH NO SIGNIFICANT PATHOLOGY.       2. Surgical [P], gastric :       -  ANTRAL MUCOSA WITH CHEMICAL/REACTIVE GASTROPATHY.       -  OXYNTIC MUCOSA WITH NO SIGNIFICANT PATHOLOGY.       -  NO HELICOBACTER PYLORI ORGANISMS IDENTIFIED ON H&E STAINED SLIDE.  3. Surgical [P], esophageal biopsy :       -  SQUAMOUS MUCOSA WITH MILD BASAL CELL HYPERPLASIA, NONSPECIFIC.   Colonoscopy 06/20/23: - The terminal ileum appeared normal. - Non- bleeding internal hemorrhoids were found during retroflexion. - Biopsies for histology were taken with a cold forceps from the entire colon for evaluation of microscopic colitis. Path:      4. Surgical [P], colon nos, random sites :       -  COLONIC MUCOSA WITH NO SIGNIFICANT PATHOLOGY.   Assessment: Constipation GERD Nausea  Patient overall is doing well currently.  Her EGD and colonoscopy that were done in 06/2023 are fairly unremarkable with the  exception of some gastritis.  Her bowel habits are better regulated on Amitiza  therapy.  The Nexium  once a day seems to be helping with her nausea and abdominal pain.  We did discuss the risks and benefits of PPI therapy today and we can definitely revisit whether or not to decrease the dosage of her Nexium  in the future.  Patient is already getting regular bone density evaluations and takes calcium/vitamin D  regularly. She also continues on her Zofran  therapy.  Will continue her current medications for now since they seem to be effective for controlling her symptoms.  Plan: - Continue Nexium  - Continue Zofran  8 mg two to three times daily -Continue Amitiza  24 mcg BID - RTC 6 months  Estefana Kidney, MD [facility] 08/19/2023, 10:16 AM  I spent 34 minutes of time, including in depth chart review, independent review of results as outlined above, communicating results with the patient directly, face-to-face time with the patient, coordinating care, and ordering studies and medications as appropriate, and documentation.

## 2023-09-06 ENCOUNTER — Other Ambulatory Visit (HOSPITAL_COMMUNITY): Payer: Self-pay

## 2023-09-07 DIAGNOSIS — M6281 Muscle weakness (generalized): Secondary | ICD-10-CM | POA: Diagnosis not present

## 2023-09-09 DIAGNOSIS — M3320 Polymyositis, organ involvement unspecified: Secondary | ICD-10-CM | POA: Diagnosis not present

## 2023-09-09 DIAGNOSIS — Z79899 Other long term (current) drug therapy: Secondary | ICD-10-CM | POA: Diagnosis not present

## 2023-09-09 DIAGNOSIS — M3313 Other dermatomyositis without myopathy: Secondary | ICD-10-CM | POA: Diagnosis not present

## 2023-09-15 ENCOUNTER — Other Ambulatory Visit: Payer: Self-pay | Admitting: Internal Medicine

## 2023-09-15 DIAGNOSIS — R600 Localized edema: Secondary | ICD-10-CM | POA: Diagnosis not present

## 2023-09-15 DIAGNOSIS — M3300 Juvenile dermatopolymyositis, organ involvement unspecified: Secondary | ICD-10-CM | POA: Diagnosis not present

## 2023-09-16 ENCOUNTER — Other Ambulatory Visit: Payer: Self-pay | Admitting: Internal Medicine

## 2023-09-16 DIAGNOSIS — R630 Anorexia: Secondary | ICD-10-CM

## 2023-09-16 DIAGNOSIS — R110 Nausea: Secondary | ICD-10-CM

## 2023-09-16 DIAGNOSIS — R634 Abnormal weight loss: Secondary | ICD-10-CM

## 2023-09-21 DIAGNOSIS — R5381 Other malaise: Secondary | ICD-10-CM | POA: Diagnosis not present

## 2023-09-21 DIAGNOSIS — M6281 Muscle weakness (generalized): Secondary | ICD-10-CM | POA: Diagnosis not present

## 2023-09-21 DIAGNOSIS — M3313 Other dermatomyositis without myopathy: Secondary | ICD-10-CM | POA: Diagnosis not present

## 2023-09-22 DIAGNOSIS — Z23 Encounter for immunization: Secondary | ICD-10-CM | POA: Diagnosis not present

## 2023-09-22 NOTE — Progress Notes (Signed)
  Subjective:    Patient ID: Patricia Blevins is a 38 y.o. adult here for a Flu Vaccine and Covid Vaccination visit.     Are you sick today?: No  Have you ever had a serious reaction to any vaccine in the past?: No (FlowSheet update)  Have you felt dizzy or faint before, during or after an immunization?: No (FlowSheet update)  Does the patient agree to be seated in a chair with arms or lie on the exam table?: Yes Does the patient agree to be observed for 15 minutes post-procedure to assess for reaction or fainting risk?: No Do you have any concerns receiving a vaccine in either arm (history of shoulder injury, mastectomy or other surgery?): No   Any patient-supplied  information was reviewed and discussed with the patient. : [name] as reviewed.  Has the VIS been reviewed? Please offer a paper copy of the VIS. Patient may decline: Yes (FlowSheet update)      Lifestyle: [place] has no history on file for tobacco use.      Objective:    Assessment/Plan:   Vaccine administered in accordance with [facility] guidelines.   Patient advised to contact [facility] if adverse event occurs.

## 2023-09-30 DIAGNOSIS — M3313 Other dermatomyositis without myopathy: Secondary | ICD-10-CM | POA: Diagnosis not present

## 2023-09-30 DIAGNOSIS — R5381 Other malaise: Secondary | ICD-10-CM | POA: Diagnosis not present

## 2023-09-30 DIAGNOSIS — M6281 Muscle weakness (generalized): Secondary | ICD-10-CM | POA: Diagnosis not present

## 2023-10-14 DIAGNOSIS — M3313 Other dermatomyositis without myopathy: Secondary | ICD-10-CM | POA: Diagnosis not present

## 2023-10-14 DIAGNOSIS — R262 Difficulty in walking, not elsewhere classified: Secondary | ICD-10-CM | POA: Diagnosis not present

## 2023-10-14 DIAGNOSIS — R5381 Other malaise: Secondary | ICD-10-CM | POA: Diagnosis not present

## 2023-10-14 DIAGNOSIS — M6281 Muscle weakness (generalized): Secondary | ICD-10-CM | POA: Diagnosis not present

## 2023-10-23 ENCOUNTER — Other Ambulatory Visit: Payer: Self-pay | Admitting: Medical Genetics

## 2023-10-23 DIAGNOSIS — Z006 Encounter for examination for normal comparison and control in clinical research program: Secondary | ICD-10-CM

## 2023-10-26 DIAGNOSIS — D830 Common variable immunodeficiency with predominant abnormalities of B-cell numbers and function: Secondary | ICD-10-CM | POA: Diagnosis not present

## 2023-10-26 DIAGNOSIS — G9339 Other post infection and related fatigue syndromes: Secondary | ICD-10-CM | POA: Diagnosis not present

## 2023-10-26 DIAGNOSIS — M3312 Other dermatopolymyositis with myopathy: Secondary | ICD-10-CM | POA: Diagnosis not present

## 2023-10-26 DIAGNOSIS — J45909 Unspecified asthma, uncomplicated: Secondary | ICD-10-CM | POA: Diagnosis not present

## 2023-10-31 DIAGNOSIS — M6281 Muscle weakness (generalized): Secondary | ICD-10-CM | POA: Diagnosis not present

## 2023-10-31 DIAGNOSIS — R5381 Other malaise: Secondary | ICD-10-CM | POA: Diagnosis not present

## 2023-10-31 DIAGNOSIS — R262 Difficulty in walking, not elsewhere classified: Secondary | ICD-10-CM | POA: Diagnosis not present

## 2023-10-31 DIAGNOSIS — M3300 Juvenile dermatopolymyositis, organ involvement unspecified: Secondary | ICD-10-CM | POA: Diagnosis not present

## 2023-10-31 DIAGNOSIS — M3313 Other dermatomyositis without myopathy: Secondary | ICD-10-CM | POA: Diagnosis not present

## 2023-11-01 DIAGNOSIS — M8580 Other specified disorders of bone density and structure, unspecified site: Secondary | ICD-10-CM | POA: Diagnosis not present

## 2023-11-01 DIAGNOSIS — U099 Post covid-19 condition, unspecified: Secondary | ICD-10-CM | POA: Diagnosis not present

## 2023-11-01 DIAGNOSIS — F411 Generalized anxiety disorder: Secondary | ICD-10-CM | POA: Diagnosis not present

## 2023-11-01 DIAGNOSIS — E538 Deficiency of other specified B group vitamins: Secondary | ICD-10-CM | POA: Diagnosis not present

## 2023-11-01 DIAGNOSIS — D830 Common variable immunodeficiency with predominant abnormalities of B-cell numbers and function: Secondary | ICD-10-CM | POA: Diagnosis not present

## 2023-11-01 DIAGNOSIS — M3320 Polymyositis, organ involvement unspecified: Secondary | ICD-10-CM | POA: Diagnosis not present

## 2023-11-01 DIAGNOSIS — C799 Secondary malignant neoplasm of unspecified site: Secondary | ICD-10-CM | POA: Diagnosis not present

## 2023-11-01 DIAGNOSIS — E559 Vitamin D deficiency, unspecified: Secondary | ICD-10-CM | POA: Diagnosis not present

## 2023-11-01 DIAGNOSIS — N3010 Interstitial cystitis (chronic) without hematuria: Secondary | ICD-10-CM | POA: Diagnosis not present

## 2023-11-01 DIAGNOSIS — K5904 Chronic idiopathic constipation: Secondary | ICD-10-CM | POA: Diagnosis not present

## 2023-11-01 DIAGNOSIS — M3312 Other dermatopolymyositis with myopathy: Secondary | ICD-10-CM | POA: Diagnosis not present

## 2023-11-01 DIAGNOSIS — Z0001 Encounter for general adult medical examination with abnormal findings: Secondary | ICD-10-CM | POA: Diagnosis not present

## 2023-11-01 DIAGNOSIS — R5383 Other fatigue: Secondary | ICD-10-CM | POA: Diagnosis not present

## 2023-11-01 DIAGNOSIS — R7301 Impaired fasting glucose: Secondary | ICD-10-CM | POA: Diagnosis not present

## 2023-11-07 DIAGNOSIS — M6281 Muscle weakness (generalized): Secondary | ICD-10-CM | POA: Diagnosis not present

## 2023-11-07 DIAGNOSIS — M3313 Other dermatomyositis without myopathy: Secondary | ICD-10-CM | POA: Diagnosis not present

## 2023-11-07 DIAGNOSIS — R5381 Other malaise: Secondary | ICD-10-CM | POA: Diagnosis not present

## 2023-11-07 DIAGNOSIS — R262 Difficulty in walking, not elsewhere classified: Secondary | ICD-10-CM | POA: Diagnosis not present

## 2023-11-07 LAB — GENECONNECT MOLECULAR SCREEN: Genetic Analysis Overall Interpretation: NEGATIVE

## 2023-11-08 NOTE — Progress Notes (Addendum)
 [facility] TELEMEDICINE VISIT  This video encounter was conducted with the patient's (or proxy's) verbal consent via secure, interactive audio and video telecommunications while in clinic/office/hospital.  The patient (or proxy) was instructed to have this encounter in a suitably private space and to only have persons present to whom they give permission to participate. In addition, the patient identity was confirmed by use of name plus two identifiers.  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 They are currently treated with Cuvitru 20%, 2 grams daily via subcutaneous push. PMH is positive for PM/SCL, dermatomyositis, muscle weakness, difficulty walking, recurrent infections, and long COVID.  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 at 2 grams daily - IgG level increased from 988 mg/dL to 8699 mg/dL after dose adjustment - No significant side effects from current Cuvitru dose - No current infections  Dermatomyositis and associated symptoms - Dermatomyositis with recent MRI showing inflammation in hips and thighs - Chronic low-level inflammation on previous EMG studies - Current medications: Rituximab , CellCept  1500 mg twice daily, prednisone  4 mg daily - Previously tapered off prednisone  but resumed at 4 mg daily after dose reduction - Naltrexone increased from 6 mg to 12 mg daily, resulting in significant improvement in fatigue and overall well-being  Fatigue and functional status - Severe fatigue and gastrointestinal symptoms following COVID vaccine on September 11th, possibly immune-mediated - Significant improvement in fatigue since naltrexone dose increased on October 15th - Weekly water physical  therapy has improved walking speed and stability  Gastrointestinal symptoms - Gastrointestinal symptoms worsened after COVID vaccine on September 11th - Improved gastrointestinal symptoms since naltrexone dose increase - No current gastrointestinal complaints  REVIEW OF SYSTEMS: 14 systems reviewed pertinent positives and negatives as noted in the HPI and below.  MEDICATIONS: Current Outpatient Medications  Medication Instructions  . acetaminophen  (TYLENOL ) 325 MG tablet 2 tablets, 4 times Daily PRN  . AMITIZA  24 mcg capsule   . baclofen (LIORESAL) 10 mg, 3 times Daily  . BIOTIN ORAL Take by mouth  . busPIRone (BUSPAR) 5 mg, 2 times Daily  . calcium carbonate-vitamin D3 (CALTRATE 600+D) 600 mg(1,500mg ) -400 unit tablet 2 tablets, Daily  . cetirizine (ZYRTEC) 5 MG tablet   . clonazePAM  (KLONOPIN ) 0.5 mg, 2 times Daily PRN  . COMPAZINE 5 mg tablet   . CUVITRU 1 gram/5 mL (20 %) subcutaneous injection 2 g, Subcutaneous, Daily  . EPINEPHrine (EPIPEN) 0.3 mg/0.3 mL auto-injector Administer 0.3mg /0.55ml intramuscularly as needed for anaphylaxis reaction may repeat once  . famotidine  (PEPCID ) 20 mg, 2 times Daily  . gabapentin  (NEURONTIN ) 600 mg, Nightly  . ibuprofen  (MOTRIN ) 600 MG tablet No dose, route, or frequency recorded.  SABRA ketoconazole (NIZORAL) 2 % shampoo Topical, Every other day, Leave on for 5 minutes, then rinse.  . Lactobacillus acidophilus (PROBIOTIC ORAL)   . multivitamin tablet 1 tablet, 2 times Daily  . mycophenolate  (CELLCEPT ) 1,500 mg, Oral, 2 times Daily  . NALTREX 6 mg  . NEXIUM  40 mg DR capsule   . ondansetron  (ZOFRAN ) 8 MG tablet TAKE 1 TABLET (8 MG) BY MOUTH 3 TIMES PER DAY AS NEEDED  . polyethylene glycol (MIRALAX) 17 g, Daily  . predniSONE  (DELTASONE ) 4 mg, Oral, Daily  . REYVOW 100 mg Tab 1 tablet, Daily  PRN  . RHODIOLA ROOT EXTRACT ORAL   . riTUXimab  (RITUXAN ) 10 mg/mL injection Patient gets every 6 months  . traZODone (DESYREL) 50 mg, Nightly PRN  .  trimethoprim 100 mg, Daily  . ubidecarenone (COQ-10 ORAL) Take by mouth  . valACYclovir  (VALTREX ) 1,000 mg, Daily   ALLERGIES: Allergies  Allergen Reactions  . Grass Pollen Itching    Other Reaction(s): eye redness, wheezing  . Pantoprazole  Other (See Comments)    Extreme muscle cramps   PHYSICAL EXAM: Physical Exam LMP  (LMP Unknown) Comment: hystect. General: Alert, oriented, well appearing, breathing comfortably at rest in no acute distress.  Deferred given video visit   IMMUNIZATIONS: Immunization History  Administered Date(s) Administered  . Covid-19 Vaccine 81mcg/0.3ml (>=51yrs) Comirnaty Pfizer-Biontech 09/22/2023  . Covid-19 Vaccine 74mcg/0.5ml (>=64yrs) SPIKEVAX Moderna ([facility] Use Only) 10/12/2021  . DTaP, unspecified 03/27/1984, 05/24/1984, 07/24/1984, 08/21/1985, 03/21/1989  . Hepatitis B, unspecified 10/13/1995, 11/17/1995, 05/02/1996  . Influenza IIV4, IM PF (3 Yr+) (AFLURIA QUAD) 11/13/2017  . Influenza IIV4, IM PF (6 mo+) (FLULAVAL/FLUZONE/FLUARIX QUAD) 11/17/2015, 12/20/2018  . Influenza IIV4, IM pres-free 10/12/2021  . Influenza, IM unspecified 09/12/2019, 11/05/2020  . MMR (>=79MO) VACCINE 05/18/1985, 08/31/1996  . PNEUMOCOCCAL (PPSV23)(>=54YRS -OR- >=2 YRS WITH RISK) VACCINE (PNEUMOVAX 23) 10/22/2022  . Pneumococcal (PCV20) (>=6WKS) vaccine (Prevnar 20) (aka PCV 20) 10/03/2020  . Polio, unspecified 03/27/1984, 05/24/1984, 07/24/1984, 08/21/1985, 03/21/1989  . TDAP (>=20YR) VACCINE (ADACEL/BOOSTRIX) 01/22/2016, 10/03/2020  . Td (Adult), unspecified 08/31/1996   RESULTS:  Component     Latest Ref Rng 07/23/2022  Immunoglobulin G, Total     767 - 1590 mg/dL 387 (L)   Immunoglobulin G, Subclass 1     341 - 894 mg/dL 705 (L)   Immunoglobulin G, Subclass 2     171 - 632 mg/dL 776   Immunoglobulin G, Subclass 3     18.4 - 106.0 mg/dL 68.9   Immunoglobulin G, Subclass 4     2.4 - 121.0 mg/dL 77.0     Component     Latest Ref Rng 07/23/2022  IgG     588  - 1,573 mg/dL 327   IgM     57 - 762 mg/dL 28 (L)   IgA     46 - 712 mg/dL 881   IgE     4 - 730 IU/mL 12   Diphtheria Toxoid IgG Ab     >=0.100 IU/mL 0.139   Tetanus Toxoid IgG Ab     >0.16 IU/mL 0.50     . IgG  Date Value Ref Range Status  10/21/2023 1,300 588 - 1,573 mg/dL Final  93/96/7974 011 411 - 1,573 mg/dL Final  92/87/7975 327 411 - 1,573 mg/dL Final  90/91/7977 227 411 - 1,573 mg/dL Final   0/05/7972 MRI  Soft tissues: Diffuse fatty infiltration and atrophy of the gluteal, hip,  thigh, abdominopelvic musculature, progressed since 2022. There is slightly  increased edema in the left distal quadriceps and biceps femoris muscles.  Possible subtle increased T2 hyperintense signal within the distal right  biceps femoris, quadriceps and proximal left gastrocnemius.   Other: Knee joints are relatively well-preserved. Mild edema along the  bilateral anterolateral body wall, nonspecific.   Impression:  1. Marked fatty infiltration/atrophy throughout the pelvic/thigh  musculature, which has progressed since 2022.  2.  Slight areas distal thigh muscular edema described above which may  represent some active inflammation.   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 They are currently treated with Cuvitru 20%, 2 grams daily via subcutaneous push. PMH is positive for PM/SCL, dermatomyositis, muscle weakness, difficulty walking, recurrent infections, and long COVID.  The recommended IgG dose for dermatomyositis is 2 g/kg per month, which would be 5-6 grams per day; however, I don't know if this goal is achievable given Brittania's history of side effects.   So far, [place] is tolerating 2 grams per day without issue; therefore, I will increase their dose to 3 grams daily and  notify Rheumatology.   This dosing regimen is supported by multiple randomized controlled trials and recent systematic reviews, including the pivotal ProDERM study and its subsequent analyses, which used 2 g/kg per month in adults with active dermatomyositis.  Dose reduction to 1 g/kg per month may be considered for maintenance in stable patients, but initial therapy and most trial protocols use the 2 g/kg dose.  IgG at this dose has demonstrated efficacy in improving muscle strength, skin disease, and overall disease activity, with a generally favorable safety profile, though monitoring for thromboembolic events is advised.   Talajah does not have a personal or family history of blood clots or PEs. Reviewed signs and symptoms.   Specific antibody deficiency  Predominately B cell defect  Recurrent sinopulmonary infections Dermatomyositis - Managed with Cuvitru 20% 2 grams subcutaneously daily - Current dose well-tolerated with minimal side effects - Goal dose is 2 g/kg per month - Plan to increase Cuvitru to 3 grams daily given subtherapeutic dose  - Will monitor for thromboembolic events, signs and symptoms reviewed with [place] - Recheck IgG levels in 3 months  - Also managed with Rituximab , Cellcept , and prednisone   Long COVID - Symptoms include fatigue, digestive issues, and fluctuating energy levels. Management focuses on immune modulation - Monitor symptom improvement with increased IgG dose - Continue current management and reassess as needed  Vaccinations - Evaluate need for future COVID vaccinations based on symptoms and exposure risk --> reacted to mRNA --> consider Novavax  References: Conda SAUNDERS, Schessl J, Charles-Schoeman C, Bata-Csrgo Z, Dimachkie MM, Griger Z, Moiseev S, Oddis CV, Schiopu E, Vencovsk J, Groton I, Clodi E, Levine T; ProDERM investigators. Safety and tolerability of intravenous immunoglobulin in patients with active dermatomyositis: results from the  randomised, placebo-controlled ProDERM study. Arthritis Res Ther. 2024 Jan 17;26(1):27. doi: 10.1186/s13075-023-03232-2. PMID: 61766114; PMCID: EFR89207127.  FOLLOW-UP: RTC in 4 months, sooner if needed   Time spent in today's visit: 10  minutes in pre-visit chart review and preparation 38  minutes talking with & examining the patient  20  minutes completing this note and orders (Note, Cuvitru PA) 00  minutes talking with the fellow or other providers about the patient    00  minutes reviewing & reporting back labs after the visit  I spent a total of 68 minutes in both face-to-face and non-face-to-face activities, excluding procedures performed, for this visit on the date of this encounter.  This patient encounter meets criteria for a level 5 based on medical complexity.    Return Patients: TIME 3 (00786): <55min 4 (99214): 30-9min 5 (99215):40-65min 5 (00784) +99417: 55-93min 5 (00784)+00582 +99417: 70-42min New Patients: TIME 3 (00756): 30-5min 4 (99244): 45-66min 5 (99245): 60-7min 5 (00754) [id number]: 75-52min 5 (00754) +00582+00582: 90-139min     Comer CHARLENA Homans, FNP [facility]  [address] [place],  [place] [address] [phone] (Fax)  For emergencies (days, nights, weekends), please call the hospital operator at [phone] and ask to speak with the allergy and immunology fellow carrying pager [phone]. Best used for: hospital admissions, immediate concerns at night or on the weekend  This note has been dictated using automated tools and reviewed for accuracy by KATHERINE ELIZABETH PRINCE.

## 2023-11-09 DIAGNOSIS — M3313 Other dermatomyositis without myopathy: Secondary | ICD-10-CM | POA: Diagnosis not present

## 2023-11-09 DIAGNOSIS — Z7960 Long term (current) use of unspecified immunomodulators and immunosuppressants: Secondary | ICD-10-CM | POA: Diagnosis not present

## 2023-11-09 DIAGNOSIS — R29898 Other symptoms and signs involving the musculoskeletal system: Secondary | ICD-10-CM | POA: Diagnosis not present

## 2023-11-09 DIAGNOSIS — D806 Antibody deficiency with near-normal immunoglobulins or with hyperimmunoglobulinemia: Secondary | ICD-10-CM | POA: Diagnosis not present

## 2023-11-09 DIAGNOSIS — M3320 Polymyositis, organ involvement unspecified: Secondary | ICD-10-CM | POA: Diagnosis not present

## 2023-11-09 DIAGNOSIS — D830 Common variable immunodeficiency with predominant abnormalities of B-cell numbers and function: Secondary | ICD-10-CM | POA: Diagnosis not present

## 2023-11-14 DIAGNOSIS — R5381 Other malaise: Secondary | ICD-10-CM | POA: Diagnosis not present

## 2023-11-14 DIAGNOSIS — M6281 Muscle weakness (generalized): Secondary | ICD-10-CM | POA: Diagnosis not present

## 2023-11-14 DIAGNOSIS — R262 Difficulty in walking, not elsewhere classified: Secondary | ICD-10-CM | POA: Diagnosis not present

## 2023-11-14 DIAGNOSIS — M3313 Other dermatomyositis without myopathy: Secondary | ICD-10-CM | POA: Diagnosis not present

## 2023-11-18 DIAGNOSIS — Z1231 Encounter for screening mammogram for malignant neoplasm of breast: Secondary | ICD-10-CM | POA: Diagnosis not present

## 2023-11-25 DIAGNOSIS — R262 Difficulty in walking, not elsewhere classified: Secondary | ICD-10-CM | POA: Diagnosis not present

## 2023-11-25 DIAGNOSIS — M6281 Muscle weakness (generalized): Secondary | ICD-10-CM | POA: Diagnosis not present

## 2023-11-25 DIAGNOSIS — R5381 Other malaise: Secondary | ICD-10-CM | POA: Diagnosis not present

## 2023-11-26 DIAGNOSIS — G4733 Obstructive sleep apnea (adult) (pediatric): Secondary | ICD-10-CM | POA: Diagnosis not present

## 2023-12-02 DIAGNOSIS — M3313 Other dermatomyositis without myopathy: Secondary | ICD-10-CM | POA: Diagnosis not present

## 2023-12-02 DIAGNOSIS — R5381 Other malaise: Secondary | ICD-10-CM | POA: Diagnosis not present

## 2023-12-02 DIAGNOSIS — M6281 Muscle weakness (generalized): Secondary | ICD-10-CM | POA: Diagnosis not present

## 2023-12-05 DIAGNOSIS — R928 Other abnormal and inconclusive findings on diagnostic imaging of breast: Secondary | ICD-10-CM | POA: Diagnosis not present

## 2023-12-14 DIAGNOSIS — M6281 Muscle weakness (generalized): Secondary | ICD-10-CM | POA: Diagnosis not present

## 2023-12-14 DIAGNOSIS — M3313 Other dermatomyositis without myopathy: Secondary | ICD-10-CM | POA: Diagnosis not present

## 2023-12-14 DIAGNOSIS — R5381 Other malaise: Secondary | ICD-10-CM | POA: Diagnosis not present

## 2023-12-16 NOTE — Progress Notes (Signed)
 Department of Rehabilitation Services  AQUATIC PT DAILY TREATMENT NOTE  Patient: Patricia Blevins (Preferred name: Patricia Blevins), DOB 1985/02/21, MRN [id number]   Visit Date: 12/14/2023  Visit Number: 14  for current Episode of Care Clinic Location:  [place] [facility] PHYSICAL/OCCUP. THERAPY [address] [place] [address] Dept: [phone] Loc: [phone]  Encounter Diagnoses:    ICD-10-CM  1. Difficulty walking  R26.2  2. Muscle weakness  M62.81  3. Dermatomyositis (CMS/HHS-HCC)  M33.13      The patient is not fearful of water.   The patient enters the pool deck area by power wheelchair. The patient enters the pool using: Ramp (RW from [facility] to ramp rail)   Patient does require assistance for entry. (CGA)  Prior to the initial aquatic session, the patient was screened for precautions such as HTN, DM, WB restrictions, history of seizures, asthma, pregnancy, neurological conditions and/or fear of water.  No contraindications such as open wounds, nausea or vomiting, fever greater than 100 degrees F, diarrhea or bowel incontinence, viral, bacterial or fungal disease were reported nor observed. Presently, Patricia Blevins (Preferred name: Patricia Blevins) is cleared for aquatic exercise.  Patient returns to clinic as stated per POC.     SUBJECTIVE   Subjective Report: Patricia Blevins reports increase in shoulder pain today; was doing upper body exercises with 2.5 lb weight and noticed pain increased after. Asks about shoulder support in bed, to help relieve some pain from dependent position    Global Rate of Change: GRoC: +3  Pain report:   Pain Assessment Pain Assessment %%: 0-10 Pain Score %%:   6 Pain Type: Acute pain Pain Loc: Shoulder Pain Orientation: Bilateral Pain Descriptors: Sore Pain Frequency: Intermittent     Falls:  Has the patient fallen since last visit: no  OBJECTIVE    Outcomes:      Vitals:  There were no vitals filed for  this visit.  TREATMENT   Aquatic therapy: x10-15 reps each exercise unless otherwise noted Exercises were performed at Chest level (~75% bodyweight supported) Body Part Treated: Trunk, Lower extremities, Upper extremities Trunk Exercises Performed: Latissimus pulldowns without buoy, Shoulder horizontal abduction/adduction with resistance paddles, Recumbent cycling, Ai Chi poses (Comment) Recumbent Cycling: While floating with noodle Lower Extremity Exercise Performed: Walking, Step ups Walking Method: Forward, Backward, Sideways without support Sideways Walking Without Support: Independently Step Ups Type: Forward/backward, Sidestepping Upper Extremity Exercise Performed: Shoulder flexion/extension, Shoulder scaption/scapular plane elevation (small shoulder circles at 90 deg)   Additional exercises: - walking marches - straight leg marching - thoracic rotation with single arm anchored at wall rail - forward punches with purple resistance bells - shoulder ab/adduction with purple resistance bells - Ai Chi poses: 6, 7, 8 - step up forward and sideways to 12 step with LLE only - step up forward and sideways to 4 step with RLE (moved to waist depth water as a progression) - recumbent cycling while sitting on noodle, 3 min  Require the use of upper extremity support during exercise? Yes: handrail and noodle Cues that were required: Core Activation, LE Alignment, UE Alignment Level of supervision/assistance required:Entire session performed with direct 1:1 PT supervision/assistance  EDUCATION   Patient/Family education: The patient received education regarding PT plan of care  and Home Exercises by verbal communication and demonstration, appeared to show willingness to learn and Return demonstration with independency.  ASSESSMENT   Patricia Blevins presents to return aquatic PT visit addressing muscle strength, pain, and mobility impairments related to dermatomyositis and  long COVID. Patricia Blevins  demonstrates tolerance for continued progression of resistance and balance exercises and reported decrease in pain at end of session. Patricia Blevins is requiring gradually less upper extremity support and reduced assistance from therapist upon exiting pool, demonstrating improving muscular endurance. Will benefit from continued aquatic PT utilizing effects of buoyancy and water viscosity for grading loading and resistance.   Aquatic therapy is most appropriate for Patricia Blevins at this time, in order to take advantage of the following:  Access  Aerobic effects  Balance and Safety  Cardiovascular effects  Edema reduction  Graded resistance  Graded weightbearing  Movement freedom  Muscular effort  Pain reduction  Proprioception  Quality of Life   Thermal effect  Well being  Goals modified or Achieved today? No:             PLAN   The plan of care was made with the patient and/or family/care partners.     Recommended Follow up: Continue 1:1 aquatic physical therapy; 1 additional pool visit, f/u with evaluating therapist for reassessment and updating POC as indicated.  Overall PT Plan for this episode of care: Continue at a frequency of 1x/week  Aquatic Plan: Frequency: 1x per week (as scheduling/availability allows) Duration: 12 weeks Aquatic visit number: 11  Current POC (if applicable) through:   Last Progress note/Goal Update: Yes (10/31/2023 12:01 AM)  Billing Information: Visit Date: 12/14/2023 Date of Onset: 11/12/22 Visit Number: 14 Session Start:: 0932 Session Stop:: 1017 Total Time: 45 minutes        Aquatic Therapy CPT 97113: 45 minutes                    Note: Per the evaluating physical therapist's plan of care, if patient does not return for follow up visit(s) related to this episode of care, this note will serve as their discharge note from physical therapy.  If patient returns to clinic with variance in plan of care, then it may be attributable to one or more of the  following factors: preferred clinician availability, appointment time request availability, therapy pool appointment availability, major holiday with clinic closure, care partner availability, patient transportation, conflicting medical appointment, inclement weather, patient illness, and/or scheduling error.  ___________________________________________________                                 Clinical Notes on MyChart: Progress notes documented by your healthcare team will now be available on the MyChart portal.  We believe that patients should be a part of the healthcare team.  We encourage you to review notes after visits and in preparation for upcoming appointments.  This provides the opportunity to review recommendations as well as to prepare questions for your healthcare team to address during your next visit.  If you identify discrepancies in the documentation or have specific questions related to the notes, please bring them to your next scheduled visit to discuss with your Physical Therapist (PT).  With increased transparency, our hope is that we create more trust, better communication, more shared decision-making, and increased satisfaction. Please be aware that these notes will not be discussed over the phone or through My Chart messages.  They will be discussed only at your next office visit with your provider.

## 2023-12-20 ENCOUNTER — Other Ambulatory Visit: Payer: Self-pay

## 2023-12-20 ENCOUNTER — Encounter: Payer: Self-pay | Admitting: Occupational Therapy

## 2023-12-20 ENCOUNTER — Ambulatory Visit: Attending: Family Medicine | Admitting: Occupational Therapy

## 2023-12-20 DIAGNOSIS — M25511 Pain in right shoulder: Secondary | ICD-10-CM | POA: Insufficient documentation

## 2023-12-20 DIAGNOSIS — R278 Other lack of coordination: Secondary | ICD-10-CM | POA: Diagnosis present

## 2023-12-20 DIAGNOSIS — R29818 Other symptoms and signs involving the nervous system: Secondary | ICD-10-CM | POA: Diagnosis present

## 2023-12-20 DIAGNOSIS — R293 Abnormal posture: Secondary | ICD-10-CM | POA: Insufficient documentation

## 2023-12-20 DIAGNOSIS — M6281 Muscle weakness (generalized): Secondary | ICD-10-CM | POA: Diagnosis present

## 2023-12-20 DIAGNOSIS — M79641 Pain in right hand: Secondary | ICD-10-CM | POA: Diagnosis present

## 2023-12-20 DIAGNOSIS — R29898 Other symptoms and signs involving the musculoskeletal system: Secondary | ICD-10-CM | POA: Insufficient documentation

## 2023-12-20 NOTE — Therapy (Unsigned)
 OUTPATIENT OCCUPATIONAL THERAPY NEURO EVALUATION  Patient Name: Patricia Blevins MRN: [id number] DOB:1985-12-06, 38 y.o., adult Today's Date: 12/20/2023  PCP: [name], MD  REFERRING PROVIDER: [name], MD  END OF SESSION:  OT End of Session - 12/20/23 1621     Visit Number 1    Number of Visits 17    Date for Recertification  02/16/24    Authorization Type [employer] [place] [employer]    OT Start Time [phone]    OT Stop Time 0932    OT Time Calculation (min) 41 min    Activity Tolerance Patient tolerated treatment well    Behavior During Therapy [facility] for tasks assessed/performed         Past Medical History:  Diagnosis Date   Anxiety    Asthma    Depression    Dermatomyositis (HCC)    Family history of breast cancer    Family history of melanoma    Long COVID    Melanoma (HCC)    Pneumonia    Substance abuse (HCC)    No drink since October   Past Surgical History:  Procedure Laterality Date   CYSTOSCOPY N/A 12/29/2020   Procedure: CYSTOSCOPY;  Surgeon: Bettina Muskrat, MD;  Location: [facility] OR;  Service: Gynecology;  Laterality: N/A;   lymph node removal     melanoma removal  2008-2009   SKIN BIOPSY  2021   Between breast for psoriasis   THIGH / KNEE SOFT TISSUE BIOPSY Right 2000   TOTAL LAPAROSCOPIC HYSTERECTOMY WITH SALPINGECTOMY N/A 12/29/2020   Procedure: TOTAL LAPAROSCOPIC HYSTERECTOMY WITH BILATERAL SALPINGECTOMY;  Surgeon: Bettina Muskrat, MD;  Location: [facility] OR;  Service: Gynecology;  Laterality: N/A;   Patient Active Problem List   Diagnosis Date Noted   Genetic testing 03/18/2023   Family history of breast cancer    Family history of melanoma    Abnormal uterine bleeding 12/29/2020   S/P laparoscopic hysterectomy 12/29/2020   Vitamin D  deficiency 01/09/2019   Elevated BP without diagnosis of hypertension 01/09/2019   Taking multiple medications for chronic disease 01/09/2019   Acute upper respiratory infection 10/05/2017   Left otitis media 10/05/2017    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   Long-term use of immunosuppressant medication 03/05/2016   Multiple benign nevi of upper extremity, lower extremity, and trunk 03/05/2016   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/05/2016   Candidiasis 01/22/2016   History of melanoma 09/12/2015   Lymphedema of upper extremity following lymphadenectomy 07/31/2015   Leg weakness, bilateral 07/31/2015   Metastatic melanoma (HCC) 12/22/2010   PM (polymyositis) (HCC) 12/22/2010   ONSET DATE: 11/28/2023 (Date of referral)  REFERRING DIAG: Polymyositis M33.20   THERAPY DIAG:  Muscle weakness (generalized)  Other symptoms and signs involving the musculoskeletal system  Other symptoms and signs involving the nervous system  Pain in right hand  Polymyositis (HCC)  Rationale for Evaluation and Treatment: Rehabilitation  SUBJECTIVE:   SUBJECTIVE STATEMENT: They were recommended to come into this clinic to work on issues with their hands. They like to paint and want to be able to continue with this. Is looking to build an accessible home in [place]. They question what recommendations therapy has for this. Is showering once a week but would like to do this more frequently. Showering is so exhausting to the point where it will be the only thing they do for the day. Texts throughout the day and worries this may be detrimental to their hands.  Pt accompanied by: self  PERTINENT HISTORY:  PMH: anxiety, asthma, depression, dermatomyositis/polymyositis, melanoma, hx of substance abuse, lymphedema of RUE, long COVID, and hysterectomy  PRECAUTIONS: Fall  WEIGHT BEARING RESTRICTIONS: No  PAIN:  Are you having pain? Yes: NPRS scale: 6/10 Pain location: R shoulder Pain description: achey Aggravating factors: prolonged use, weather Relieving factors: stretching, heat Reports R shoulder pain that shoots down the arm. Repetitive, prolonged activities usually result in their arms giving out.  FALLS: Has  patient fallen in last 6 months? No  LIVING ENVIRONMENT: 2 story home Lives with parents, ramp to enter, adaptive vehicle to ride in  Rollator, power chair, stair lift for getting upstairs, renovated bathroom widened door has a manual chair upstairs, uses a cane to get into bathroom, walk in shower, grab bars, toilet lift, standing in shower because unable to stand up from sitting without assistance Mechanical bed, SPC, rollator walker, powerchair    Handheld shower head, long handled sponge, reacher, bidet; table arm supports; shoulder support  PLOF: Independent with basic ADLs and Needs assistance with homemaking; on disability; does not drive  PATIENT GOALS: ***  OBJECTIVE:  Note: Objective measures were completed at Evaluation unless otherwise noted.  HAND DOMINANCE: Right  ADLs: Overall ADLs: mod I  IADLs: Medication management: occasional setup Financial management: mod I Handwriting: 100% legible, Increased time, and unable to write anything more than a sentence without fatigue  MOBILITY STATUS: I with power wheelchair  ACTIVITY TOLERANCE: Activity tolerance: poor  FUNCTIONAL OUTCOME MEASURES: PSFS:  Total score = sum of the activity scores/number of activities Minimum detectable change (90%CI) for average score = 2 points Minimum detectable change (90%CI) for single activity score = 3 points   UPPER EXTREMITY ROM:  70* shoulder flexion B; all other planes WFL for elbows, wrists, digits  UPPER EXTREMITY MMT:   BUE - BFL  HAND FUNCTION: {handfunction:27230}  COORDINATION: {otcoordination:27237}  SENSATION: {sensation:27233}  EDEMA: ***  MUSCLE TONE: {UETONE:25567}  COGNITION: Overall cognitive status: {cognition:24006}  VISION: Subjective report: *** Baseline vision: {OTBASELINEVISION:25363} Visual history: {OTVISUALHISTORY:25364}  VISION ASSESSMENT: {visionassessment:27231}  Patient has difficulty with following activities due to following  visual impairments: ***  PERCEPTION: {Perception:25564}  PRAXIS: {Praxis:25565}  OBSERVATIONS: ***                                                                                                                             TREATMENT: ***         PATIENT EDUCATION: Education details: *** Person educated: {Person educated:25204} Education method: {Education Method:25205} Education comprehension: {Education Comprehension:25206}  HOME EXERCISE PROGRAM: ***   GOALS:  SHORT TERM GOALS: Target date: ***  *** Baseline: Goal status: INITIAL  2.  *** Baseline:  Goal status: INITIAL  3.  *** Baseline:  Goal status: INITIAL  4.  *** Baseline:  Goal status: INITIAL  5.  *** Baseline:  Goal status: INITIAL  6.  *** Baseline:  Goal status: INITIAL  LONG TERM GOALS: Target date: 02/16/2024  *** Baseline:  Goal status: INITIAL  2.  *** Baseline:  Goal status: INITIAL  3.  *** Baseline:  Goal status: INITIAL  4.  *** Baseline:  Goal status: INITIAL  5.  *** Baseline:  Goal status: INITIAL  6.  *** Baseline:  Goal status: INITIAL  ASSESSMENT:  CLINICAL IMPRESSION: Patient is a 38 y.o. adult who was seen today for occupational therapy evaluation for polymyositis with right hand weakness. Hx includes anxiety, asthma, depression, dermatomyositis/polymyositis, melanoma, hx of substance abuse, lymphedema of RUE, long COVID, and hysterectomy. Patient currently presents with B hand pain and weakness R>L in addition to functional deficits and impairments as noted below. Pt would benefit from skilled OT services in the outpatient setting to work on impairments as noted below to help pt return to PLOF as able.    PERFORMANCE DEFICITS: in functional skills including ADLs, IADLs, coordination, ROM, strength, pain, Fine motor control, Gross motor control, mobility, body mechanics, endurance, decreased knowledge of use of DME, and UE functional use.    IMPAIRMENTS: are limiting patient from ADLs, IADLs, and leisure.   CO-MORBIDITIES: may have co-morbidities  that affects occupational performance. Patient will benefit from skilled OT to address above impairments and improve overall function.  MODIFICATION OR ASSISTANCE TO COMPLETE EVALUATION: Min-Moderate modification of tasks or assist with assess necessary to complete an evaluation.  OT OCCUPATIONAL PROFILE AND HISTORY: Detailed assessment: Review of records and additional review of physical, cognitive, psychosocial history related to current functional performance.  CLINICAL DECISION MAKING: Moderate - several treatment options, min-mod task modification necessary  REHAB POTENTIAL: Good  EVALUATION COMPLEXITY: Moderate    PLAN:  OT FREQUENCY: 2x/week  OT DURATION: 8 weeks  PLANNED INTERVENTIONS: 97168 OT Re-evaluation, 97535 self care/ADL training, 02889 therapeutic exercise, 97530 therapeutic activity, 97112 neuromuscular re-education, 97140 manual therapy, 97035 ultrasound, 97018 paraffin, 02960 fluidotherapy, 97010 moist heat, 97760 Orthotic Initial, 97763 Orthotic/Prosthetic subsequent, energy conservation, coping strategies training, patient/family education, and DME and/or AE instructions  RECOMMENDED OTHER SERVICES: N/A for this visit (receives aquatic therapy at another clinic)  CONSULTED AND AGREED WITH PLAN OF CARE: Patient  PLAN FOR NEXT SESSION: Assess grip and 9 hole Shoulder taping (1,2,3)  Jocelyn CHRISTELLA Bottom, OT 12/20/2023, 4:28 PM

## 2023-12-22 DIAGNOSIS — R5381 Other malaise: Secondary | ICD-10-CM | POA: Diagnosis not present

## 2023-12-22 DIAGNOSIS — R262 Difficulty in walking, not elsewhere classified: Secondary | ICD-10-CM | POA: Diagnosis not present

## 2023-12-22 DIAGNOSIS — M3300 Juvenile dermatopolymyositis, organ involvement unspecified: Secondary | ICD-10-CM | POA: Diagnosis not present

## 2023-12-22 DIAGNOSIS — M3313 Other dermatomyositis without myopathy: Secondary | ICD-10-CM | POA: Diagnosis not present

## 2023-12-22 DIAGNOSIS — M6281 Muscle weakness (generalized): Secondary | ICD-10-CM | POA: Diagnosis not present

## 2023-12-27 ENCOUNTER — Ambulatory Visit: Admitting: Occupational Therapy

## 2023-12-27 DIAGNOSIS — M25511 Pain in right shoulder: Secondary | ICD-10-CM

## 2023-12-27 DIAGNOSIS — M6281 Muscle weakness (generalized): Secondary | ICD-10-CM | POA: Diagnosis not present

## 2023-12-27 DIAGNOSIS — R29898 Other symptoms and signs involving the musculoskeletal system: Secondary | ICD-10-CM

## 2023-12-27 DIAGNOSIS — R293 Abnormal posture: Secondary | ICD-10-CM

## 2023-12-27 DIAGNOSIS — R29818 Other symptoms and signs involving the nervous system: Secondary | ICD-10-CM

## 2023-12-27 NOTE — Therapy (Signed)
 OUTPATIENT OCCUPATIONAL THERAPY NEURO TREATMENT  Patient Name: Patricia Blevins MRN: [id number] DOB:[birth year], 38 y.o., adult Today's Date: 12/27/2023  PCP: [name], MD  REFERRING PROVIDER: [name], MD  END OF SESSION:  OT End of Session - 12/27/23 1106     Visit Number 2    Number of Visits 17    Date for Recertification  02/16/24    Authorization Type [employer]    OT Start Time 1105    OT Stop Time 1150    OT Time Calculation (min) 45 min    Equipment Utilized During Treatment K tape    Activity Tolerance Patient tolerated treatment well    Behavior During Therapy WFL for tasks assessed/performed         Past Medical History:  Diagnosis Date   Anxiety    Asthma    Depression    Dermatomyositis (HCC)    Family history of breast cancer    Family history of melanoma    Long COVID    Melanoma (HCC)    Pneumonia    Substance abuse (HCC)    No drink since October   Past Surgical History:  Procedure Laterality Date   CYSTOSCOPY N/A 12/29/2020   Procedure: CYSTOSCOPY;  Surgeon: Bettina Muskrat, MD;  Location: [facility] OR;  Service: Gynecology;  Laterality: N/A;   lymph node removal     melanoma removal  2008-2009   SKIN BIOPSY  2021   Between breast for psoriasis   THIGH / KNEE SOFT TISSUE BIOPSY Right 2000   TOTAL LAPAROSCOPIC HYSTERECTOMY WITH SALPINGECTOMY N/A 12/29/2020   Procedure: TOTAL LAPAROSCOPIC HYSTERECTOMY WITH BILATERAL SALPINGECTOMY;  Surgeon: Bettina Muskrat, MD;  Location: [facility] OR;  Service: Gynecology;  Laterality: N/A;   Patient Active Problem List   Diagnosis Date Noted   Genetic testing 03/18/2023   Family history of breast cancer    Family history of melanoma    Abnormal uterine bleeding 12/29/2020   S/P laparoscopic hysterectomy 12/29/2020   Vitamin D  deficiency 01/09/2019   Elevated BP without diagnosis of hypertension 01/09/2019   Taking multiple medications for chronic disease 01/09/2019   Acute upper respiratory  infection 10/05/2017   Left otitis media 10/05/2017   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   Long-term use of immunosuppressant medication 03/05/2016   Multiple benign nevi of upper extremity, lower extremity, and trunk 03/05/2016   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/05/2016   Candidiasis 01/22/2016   History of melanoma 09/12/2015   Lymphedema of upper extremity following lymphadenectomy 07/31/2015   Leg weakness, bilateral 07/31/2015   Metastatic melanoma (HCC) 12/22/2010   PM (polymyositis) (HCC) 12/22/2010   ONSET DATE: 11/28/2023 (Date of referral)  REFERRING DIAG: Polymyositis M33.20   THERAPY DIAG:  Muscle weakness (generalized)  Abnormal posture  Other symptoms and signs involving the musculoskeletal system  Other symptoms and signs involving the nervous system  Right shoulder pain, unspecified chronicity  Rationale for Evaluation and Treatment: Rehabilitation  SUBJECTIVE:   SUBJECTIVE STATEMENT: Pt reports they have different levels of pain in different areas.  Low back was 7/10 upon arrival and shoulders 5/10.  When asked about WC positioning, pt reported they couldn't remember exactly why the additional back support from an office chair was place in their WC.  Pt accompanied by: self  PERTINENT HISTORY: PMH: anxiety, asthma, depression, dermatomyositis/polymyositis, melanoma, hx of substance abuse, lymphedema of RUE, long COVID, and hysterectomy  PRECAUTIONS: Fall  WEIGHT BEARING RESTRICTIONS: No  PAIN:  Are you having pain? Yes:  NPRS scale: Low back 7, shoulders 5/10 Pain location: low back and shoulders Pain description: sore Aggravating factors: prolonged use, weather - joint  Relieving factors: stretching, heat Reports R shoulder pain that shoots down the arm. Repetitive, prolonged activities usually result in their arms giving out.  FALLS: Has patient fallen in last 6 months? No  LIVING ENVIRONMENT: 2 story home Lives with parents, ramp  to enter, adaptive vehicle to ride in  Rollator, power chair, stair lift for getting upstairs, renovated bathroom widened door has a manual chair upstairs, uses a cane to get into bathroom, walk in shower, grab bars, toilet lift, standing in shower because unable to stand up from sitting without assistance Mechanical bed, SPC, rollator walker, powerchair    Handheld shower head, long handled sponge, reacher, bidet; table arm supports; shoulder support  PLOF: Independent with basic ADLs and Needs assistance with homemaking; on disability; does not drive  PATIENT GOALS: Improve use of hands/hand pain/continue with leisure activities Subjective note at eval - They were recommended to come into this clinic to work on issues with their hands. They like to paint and want to be able to continue with this. Is looking to build an accessible home in [place]. They question what recommendations therapy has for this. Is showering once a week but would like to do this more frequently. Showering is so exhausting to the point where it will be the only thing they do for the day. Texts throughout the day and worries this may be detrimental to their hands.   OBJECTIVE:  Note: Objective measures were completed at Evaluation unless otherwise noted.  HAND DOMINANCE: Right  ADLs: Overall ADLs: mod I  IADLs: Medication management: occasional setup Financial management: mod I Handwriting: 100% legible, Increased time, and unable to write anything more than a sentence without fatigue  MOBILITY STATUS: I with power wheelchair  ACTIVITY TOLERANCE: Activity tolerance: poor  FUNCTIONAL OUTCOME MEASURES: PSFS:  Total score = sum of the activity scores/number of activities Minimum detectable change (90%CI) for average score = 2 points Minimum detectable change (90%CI) for single activity score = 3 points   UPPER EXTREMITY ROM:  70* shoulder flexion B; all other planes WFL for elbows, wrists, digits  UPPER  EXTREMITY MMT:   BUE - BFL  HAND FUNCTION: 12/27/23 - Right 41.6, 40.1, 41.2 Left 54.2, 59.7, 55.7 Average: Right: 41.0 lbs Left: 56.5 lbs  COORDINATION: 12/27/23 - Right 26.18 sec Left 28.30 sec   SENSATION: Paresthesias reported in RUE  EDEMA: none reported or observed  MUSCLE TONE: N/T  COGNITION: Overall cognitive status: Within functional limits for tasks assessed  VISION: Subjective report: no changes reported Baseline vision: Wears glasses all the time Visual history: none  VISION ASSESSMENT: WFL  PERCEPTION: WFL  PRAXIS: WFL  OBSERVATIONS: Pt independent with use of power wheelchair. The pt appears well kept and has glasses donned. Wrist positioning strays from neutral with functional use.  TODAY'S TREATMENT:    - Self Care education and training completed for duration as noted below including:  Tested pt's grip strength and coordination today via 9 hole peg test.  Pt stronger in non dominant LUE than RUE -- Right: 41.0 lbs Left: 56.5 lbs BUT slightly faster with RUE than LUE -- Right 26.18 sec Left 28.30 sec.  Session then focused on postural alignment, positioning, and education to address reported shoulder instability, discomfort, and paresthesias associated with their medical issues.  OT assessed patients power wheelchair positioning and noted suboptimal pelvic and trunk alignment contributing to posterior pelvic tilt, excessive lumbar support, therefore hyperextension to rest their back on WC backrest which increases pressure through the coccyx, and results in compensatory upper-extremity positioning likely increasing shoulder discomfort. Trialed environmental and seating modifications during the session, including removal of a rather large memory foam lumbar support designed for an office chair and replaced with a small towel roll. This  adjustment allowed the patient to scoot further back in the seat, achieve improved pelvic positioning and lumbar support, and reduce sacral/coccygeal pressure. Patient reported improved comfort and stability with revised setup even noting pain down from 7/10 initially to 4-5 by end of session.  OT provided education on sleep and bed positioning (as outlined in patient instructions) to reduce stress on upper-extremity joints and peripheral nerves, promote shoulder support, and minimize risk of worsening pain and paresthesias. Education included use of appropriate pillow placement to support the upper extremities, maintain neutral shoulder positioning, and improve overall postural alignment during rest.  Given patients use of an adjustable bed, OT instructed patient to ensure hips are positioned at the hinge point of the bed to prevent excessive flexion through the lumbar spine. Patient reports currently using multiple pillows behind their back when sitting up in bed; OT provided guidance on alternative positioning strategies, including scooting hips further back, reducing excessive trunk flexion, and trialing a small pillow or towel roll under the knees to improve pelvic alignment and decrease lumbar strain.  Patient demonstrated understanding of positioning recommendations and verbalized willingness to trial adjustments at home. Handout provided to reinforce education.  Neuromuscular reeducation - provided via Kinesio taping to support the neuromuscular system and promote functional positioning and movement of R UE for activities with decreased pain complaints. Kinesio Taping (KT) technique applied to right shoulder only today to address glenohumeral subluxation/instability and pain and allow comparison to non-taped LUE. Tape applied using mechanical correction and space correction techniques with moderate tension to facilitate humeral head approximation toward the glenoid fossa, provide external support  to limit downward and forward displacement due to gravity and muscle weakness and improve proprioceptive input for posture and positioning during seated activities.  Taping pattern included: Middle deltoid strip for stabilization, posterior deltoid strip with 30-50% tension to support shoulder approximation and anterior deltoid strip for superior shoulder capsule positioning to address pain and poor positioning otherwise.  Patient tolerated application well and reported good comfort prior to leaving therapy.  Education provided on wear 3 days and removal techniques prior to aquatic PT at the end of the week.  PATIENT EDUCATION: Education details: WC and bed positioning; Ktape trial Person educated: Patient Education method: Explanation, Demonstration, Tactile cues, Verbal cues, and Handouts Education comprehension: verbalized understanding, returned demonstration, verbal cues required, tactile cues required, and needs further education  HOME EXERCISE PROGRAM: 12/27/23: Bed positioning, Ktape trial  GOALS:  SHORT TERM GOALS: Target date: 01/18/2024    Patient will demonstrate initial UE HEP with 25% verbal  cues or less for proper execution. Baseline: Goal status: IN Progress  2.  Patient will demonstrate at least 45+ lbs dominant RUE grip strength as needed to open jars and other containers. Baseline: Right: 41.0 lbs Left: 56.5 lbs Goal status: IN Progress  3.  Patient will demo improved FM coordination as evidenced by completing nine-hole peg with use of BUE in 25 seconds or less. Baseline: Right 26.18 sec Left 28.30 sec  Goal status: IN Progress  4.  Pt will independently recall at least 3 joint protection, ergonomics, and body mechanic principles as noted in pt instructions.   Baseline:  Goal status: IN Progress  5.  Patient will independently recall at least 3 energy conservation principles in relation to ADLs to increase functional independence.  Baseline:  Goal status:  INITIAL   LONG TERM GOALS: Target date: 02/16/2024  Patient will demonstrate updated UE HEP with visual handouts only for proper execution. Baseline:  Goal status: INITIAL  2.  Patient will report at least two-point increase in average PSFS score or at least three-point increase in a single activity score indicating functionally significant improvement given minimum detectable change.  Baseline: 3.7 total score (See above for individual activity scores)  Goal status: INITIAL  ASSESSMENT:  CLINICAL IMPRESSION: Patient is a 38 y.o. adult who was seen today for occupational therapy treatment for polymyositis with B hand pain and weakness R>L.  Extensive education provided re: modifications to seated and bed positioning to improve low back pain, UE support and decrease pressure on coccyx. Pt would benefit from skilled OT services in the outpatient setting to work on impairments as noted below to help pt maximize functional daily activities. PERFORMANCE DEFICITS: in functional skills including ADLs, IADLs, coordination, ROM, strength, pain, Fine motor control, Gross motor control, mobility, body mechanics, endurance, decreased knowledge of use of DME, and UE functional use.   IMPAIRMENTS: are limiting patient from ADLs, IADLs, and leisure.   CO-MORBIDITIES: may have co-morbidities  that affects occupational performance. Patient will benefit from skilled OT to address above impairments and improve overall function.  REHAB POTENTIAL: Good  PLAN:  OT FREQUENCY: 2x/week  OT DURATION: 8 weeks  PLANNED INTERVENTIONS: 97168 OT Re-evaluation, 97535 self care/ADL training, 02889 therapeutic exercise, 97530 therapeutic activity, 97112 neuromuscular re-education, 97140 manual therapy, 97035 ultrasound, 97018 paraffin, 02960 fluidotherapy, 97010 moist heat, 97760 Orthotic Initial, 97763 Orthotic/Prosthetic subsequent, energy conservation, coping strategies training, patient/family education, and DME  and/or AE instructions  RECOMMENDED OTHER SERVICES: N/A for this visit (receives aquatic therapy at another clinic)  CONSULTED AND AGREED WITH PLAN OF CARE: Patient  PLAN FOR NEXT SESSION:  Check on positioning changes and comfort Check on effectiveness of Ktape RUE vs LUE and Video Shoulder taping if desired for home carryover  Energy Conservation HEP ideas - resistance foam vs putty, coordination activities etc  Clarita LITTIE Pride, OT 12/27/2023, 11:08 AM

## 2023-12-27 NOTE — Patient Instructions (Signed)
   WEBSITE: [email or web address]

## 2023-12-28 DIAGNOSIS — M3312 Other dermatopolymyositis with myopathy: Secondary | ICD-10-CM | POA: Diagnosis not present

## 2023-12-28 DIAGNOSIS — N62 Hypertrophy of breast: Secondary | ICD-10-CM | POA: Diagnosis not present

## 2023-12-28 DIAGNOSIS — M549 Dorsalgia, unspecified: Secondary | ICD-10-CM | POA: Diagnosis not present

## 2023-12-28 DIAGNOSIS — F648 Other gender identity disorders: Secondary | ICD-10-CM | POA: Diagnosis not present

## 2023-12-30 DIAGNOSIS — M3313 Other dermatomyositis without myopathy: Secondary | ICD-10-CM | POA: Diagnosis not present

## 2023-12-30 DIAGNOSIS — M6281 Muscle weakness (generalized): Secondary | ICD-10-CM | POA: Diagnosis not present

## 2023-12-30 DIAGNOSIS — R5381 Other malaise: Secondary | ICD-10-CM | POA: Diagnosis not present

## 2023-12-30 DIAGNOSIS — R262 Difficulty in walking, not elsewhere classified: Secondary | ICD-10-CM | POA: Diagnosis not present

## 2024-01-03 ENCOUNTER — Ambulatory Visit: Admitting: Occupational Therapy

## 2024-01-03 DIAGNOSIS — M6281 Muscle weakness (generalized): Secondary | ICD-10-CM | POA: Diagnosis not present

## 2024-01-03 DIAGNOSIS — M25511 Pain in right shoulder: Secondary | ICD-10-CM

## 2024-01-03 DIAGNOSIS — R29898 Other symptoms and signs involving the musculoskeletal system: Secondary | ICD-10-CM

## 2024-01-03 DIAGNOSIS — R293 Abnormal posture: Secondary | ICD-10-CM

## 2024-01-03 DIAGNOSIS — R29818 Other symptoms and signs involving the nervous system: Secondary | ICD-10-CM

## 2024-01-03 NOTE — Therapy (Signed)
 " OUTPATIENT OCCUPATIONAL THERAPY NEURO TREATMENT  Patient Name: Patricia Blevins MRN: [id number] DOB:1986-01-10, 38 y.o., adult Today's Date: 01/03/2024  PCP: [name], MD  REFERRING PROVIDER: [name], MD  END OF SESSION:  OT End of Session - 01/03/24 0800     Visit Number 3    Number of Visits 17    Date for Recertification  02/16/24    Authorization Type [employer]    OT Start Time 0800    OT Stop Time 0853    OT Time Calculation (min) 53 min    Equipment Utilized During Treatment Putty, K tape    Activity Tolerance Patient tolerated treatment well    Behavior During Therapy WFL for tasks assessed/performed         Past Medical History:  Diagnosis Date   Anxiety    Asthma    Depression    Dermatomyositis (HCC)    Family history of breast cancer    Family history of melanoma    Long COVID    Melanoma (HCC)    Pneumonia    Substance abuse (HCC)    No drink since October   Past Surgical History:  Procedure Laterality Date   CYSTOSCOPY N/A 12/29/2020   Procedure: CYSTOSCOPY;  Surgeon: Bettina Muskrat, MD;  Location: [facility] OR;  Service: Gynecology;  Laterality: N/A;   lymph node removal     melanoma removal  2008-2009   SKIN BIOPSY  2021   Between breast for psoriasis   THIGH / KNEE SOFT TISSUE BIOPSY Right 2000   TOTAL LAPAROSCOPIC HYSTERECTOMY WITH SALPINGECTOMY N/A 12/29/2020   Procedure: TOTAL LAPAROSCOPIC HYSTERECTOMY WITH BILATERAL SALPINGECTOMY;  Surgeon: Bettina Muskrat, MD;  Location: [facility] OR;  Service: Gynecology;  Laterality: N/A;   Patient Active Problem List   Diagnosis Date Noted   Genetic testing 03/18/2023   Family history of breast cancer    Family history of melanoma    Abnormal uterine bleeding 12/29/2020   S/P laparoscopic hysterectomy 12/29/2020   Vitamin D  deficiency 01/09/2019   Elevated BP without diagnosis of hypertension 01/09/2019   Taking multiple medications for chronic disease 01/09/2019   Acute upper respiratory  infection 10/05/2017   Left otitis media 10/05/2017   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   Long-term use of immunosuppressant medication 03/05/2016   Multiple benign nevi of upper extremity, lower extremity, and trunk 03/05/2016   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/05/2016   Candidiasis 01/22/2016   History of melanoma 09/12/2015   Lymphedema of upper extremity following lymphadenectomy 07/31/2015   Leg weakness, bilateral 07/31/2015   Metastatic melanoma (HCC) 12/22/2010   PM (polymyositis) (HCC) 12/22/2010   ONSET DATE: 11/28/2023 (Date of referral)  REFERRING DIAG: Polymyositis M33.20   THERAPY DIAG:  Muscle weakness (generalized)  Abnormal posture  Other symptoms and signs involving the musculoskeletal system  Other symptoms and signs involving the nervous system  Right shoulder pain, unspecified chronicity  Rationale for Evaluation and Treatment: Rehabilitation  SUBJECTIVE:   SUBJECTIVE STATEMENT:  Pt reports having some 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  They paint fairly well but have some difficulty with pencil crayons are harder.    Pt reports generalized discomfort/stiffness today versus pain ~ 3/10 .    When asked about WC positioning, pt reported they did feel better with positioning s/p changes made last week.  They also report good comfort with Ktape last week but it did not last long enough.   Pt accompanied by:  self  PERTINENT HISTORY: PMH: anxiety, asthma, depression, dermatomyositis/polymyositis, melanoma, hx of substance abuse, lymphedema of RUE, long COVID, and hysterectomy  PRECAUTIONS: Fall  WEIGHT BEARING RESTRICTIONS: No  PAIN:  Are you having pain? Yes: NPRS scale: 3/10 Pain location: low back and shoulders Pain description: discomfort Aggravating factors: prolonged use  Relieving factors: stretching, heat Repetitive, prolonged activities usually result in their arms giving out.  FALLS: Has  patient fallen in last 6 months? No  LIVING ENVIRONMENT: 2 story home Lives with parents, ramp to enter, adaptive vehicle to ride in  Rollator, power chair, stair lift for getting upstairs, renovated bathroom widened door has a manual chair upstairs, uses a cane to get into bathroom, walk in shower, grab bars, toilet lift, standing in shower because unable to stand up from sitting without assistance Mechanical bed, SPC, rollator walker, powerchair    Handheld shower head, long handled sponge, reacher, bidet; table arm supports; shoulder support  PLOF: Independent with basic ADLs and Needs assistance with homemaking; on disability; does not drive  PATIENT GOALS: Improve use of hands/hand pain/continue with leisure activities Subjective note at eval - They were recommended to come into this clinic to work on issues with their hands. They like to paint and want to be able to continue with this. Is looking to build an accessible home in [place]. They question what recommendations therapy has for this. Is showering once a week but would like to do this more frequently. Showering is so exhausting to the point where it will be the only thing they do for the day. Texts throughout the day and worries this may be detrimental to their hands.   OBJECTIVE:  Note: Objective measures were completed at Evaluation unless otherwise noted.  HAND DOMINANCE: Right  ADLs: Overall ADLs: mod I  IADLs: Medication management: occasional setup Financial management: mod I Handwriting: 100% legible, Increased time, and unable to write anything more than a sentence without fatigue  MOBILITY STATUS: I with power wheelchair  ACTIVITY TOLERANCE: Activity tolerance: poor  FUNCTIONAL OUTCOME MEASURES: PSFS:  Total score = sum of the activity scores/number of activities Minimum detectable change (90%CI) for average score = 2 points Minimum detectable change (90%CI) for single activity score = 3 points   UPPER  EXTREMITY ROM:  70* shoulder flexion B; all other planes WFL for elbows, wrists, digits  UPPER EXTREMITY MMT:   BUE - BFL  HAND FUNCTION: 12/27/23 - Right 41.6, 40.1, 41.2 Left 54.2, 59.7, 55.7 Average: Right: 41.0 lbs Left: 56.5 lbs  COORDINATION: 12/27/23 - Right 26.18 sec Left 28.30 sec   SENSATION: Paresthesias reported in RUE  EDEMA: none reported or observed  MUSCLE TONE: N/T  COGNITION: Overall cognitive status: Within functional limits for tasks assessed  VISION: Subjective report: no changes reported Baseline vision: Wears glasses all the time Visual history: none  VISION ASSESSMENT: WFL  PERCEPTION: WFL  PRAXIS: WFL  OBSERVATIONS: Pt independent with use of power wheelchair. The pt appears well kept and has glasses donned. Wrist positioning strays from neutral with functional use.  TODAY'S TREATMENT:    - Self Care education and training completed for duration as noted below including:  Reviewed postural alignment, positioning, and adjustment of small towel roll in the lumbar region and education on options to improve carryover ie) towel roll behind the foam padding inside the backrest on the WC.  OT initiated conversation re: joint protection ideas ie)   - Respecting for Pain and stopping activities before they reach the point of discomfort or pain  - Rest and Work Balance ie) balancing activities with appropriate rests during activity - Use of Assistive Equipment - Consider AE equipment to protect joints from deformity and stresses including exploring ideas for art work ie) spring loaded scissors for cutting activities. Encouraged patient to look adaptive equipment for arthritis [i.e. to consider joint protection].   Neuromuscular reeducation - provided via Kinesio taping to support the neuromuscular system and promote functional  positioning and movement of R and L UE for improved shoulder alignment and support to decrease discomfort. Kinesio Taping (KT) technique applied to both shoulders today to address glenohumeral instability. Tape applied using mechanical correction and space correction techniques with moderate tension to facilitate humeral head approximation toward the glenoid fossa, provide external support to limit downward and forward displacement due to gravity and muscle weakness and improve proprioceptive input for posture and positioning during seated activities. Same taping pattern used from last week - Middle deltoid strip for stabilization, posterior deltoid strip with 30-50% tension to support shoulder approximation and anterior deltoid strip for superior shoulder capsule positioning.  Patient tolerated application well and reported good comfort prior to leaving therapy.  - Therapeutic exercises and activities completed for duration as noted below including:  Pt issued tendon gliding exercises/handout with review of motions to isolate DIP, PIP and MCP joints for straight finger position, hook (DIP/PIP flexion), fist (DIP/PIP/MCP flexion), taco/duck (MCP flexion only) and flat fist (MCP and PIP flexion). Education provided on purpose of tendon glide exercises ie) to increase the circulation to the hand and wrist, reduce swelling and promote healthier soft tissue for increased AROM and not to build hand or wrist strength at this time.  Initiated Putty Exercises with yellow putty to begin strengthening, coordination and sensory stimulation of B UEs.  Patient provided visual demonstration, verbal and tactile cues as needed to improve performance of the various exercises/activities including:   - Putty Squeezes - cues to squeeze putty into log for use with other exercises and to fold putty in half with 1 hand  - Putty Rolls - encourage to roll putty into logs with sensory stimulation to entire length of hand, fingers  and wrist as needed   - Pinch and Pull with Putty - this motion generally completed after twisting the putty and also combined with Key Pinch motion and   - Removing Objects from Putty  - encouraged to hide items (coins, marble, dice etc) and use one hand at a time to find the objects and identify them by tactile input before s/he digs them out and can see them visually.  OT educated patient on theraputty recommendations: avoid small children, pets, hot environments, place in designated container and avoid contact with fabrics. Patient verbalized understanding.    Patient benefited from extra time, verbal/tactile cues, and modeling of task to allow time for processing of verbal instructions and improve motor planning of unfamiliar movements.   PATIENT EDUCATION: Education details: Tendon Glide/Putty HEP; Ktape trial Person educated: Patient Education method: Explanation, Demonstration, Tactile cues, Verbal cues, and Handouts  Education comprehension: verbalized understanding, returned demonstration, verbal cues required, tactile cues required, and needs further education  HOME EXERCISE PROGRAM: 12/27/23: Bed positioning, Ktape trial 01/03/24: Tendon Glides/Putty HEP Access Code: [email or web address]  GOALS:  SHORT TERM GOALS: Target date: 01/18/2024    Patient will demonstrate initial UE HEP with 25% verbal cues or less for proper execution. Baseline: Goal status: IN Progress  2.  Patient will demonstrate at least 45+ lbs dominant RUE grip strength as needed to open jars and other containers. Baseline: Right: 41.0 lbs Left: 56.5 lbs Goal status: IN Progress  3.  Patient will demo improved FM coordination as evidenced by completing nine-hole peg with use of BUE in 25 seconds or less. Baseline: Right 26.18 sec Left 28.30 sec  Goal status: IN Progress  4.  Pt will independently recall at least 3 joint protection, ergonomics, and body mechanic principles as noted in pt instructions.   Baseline:   Goal status: IN Progress  5.  Patient will independently recall at least 3 energy conservation principles in relation to ADLs to increase functional independence.  Baseline:  Goal status: IN Progress   LONG TERM GOALS: Target date: 02/16/2024  Patient will demonstrate updated UE HEP with visual handouts only for proper execution. Baseline:  Goal status: INITIAL  2.  Patient will report at least two-point increase in average PSFS score or at least three-point increase in a single activity score indicating functionally significant improvement given minimum detectable change.  Baseline: 3.7 total score (See above for individual activity scores)  Goal status: INITIAL  ASSESSMENT:  CLINICAL IMPRESSION: Patient is a 38 y.o. adult who was seen today for occupational therapy treatment for polymyositis with B hand/shoulder pain and weakness R>L.  Reviewed education re: modifications to WC backrest, UE support with K tape and initial HEP ideas ie) tendon glides and initial putty activities. Pt would benefit from skilled OT services in the outpatient setting to work on impairments as noted below to help pt maximize functional daily activities. PERFORMANCE DEFICITS: in functional skills including ADLs, IADLs, coordination, ROM, strength, pain, Fine motor control, Gross motor control, mobility, body mechanics, endurance, decreased knowledge of use of DME, and UE functional use.   IMPAIRMENTS: are limiting patient from ADLs, IADLs, and leisure.   CO-MORBIDITIES: may have co-morbidities  that affects occupational performance. Patient will benefit from skilled OT to address above impairments and improve overall function.  REHAB POTENTIAL: Good  PLAN:  OT FREQUENCY: 2x/week  OT DURATION: 8 weeks  PLANNED INTERVENTIONS: 97168 OT Re-evaluation, 97535 self care/ADL training, 02889 therapeutic exercise, 97530 therapeutic activity, 97112 neuromuscular re-education, 97140 manual therapy, 97035  ultrasound, 97018 paraffin, 02960 fluidotherapy, 97010 moist heat, 97760 Orthotic Initial, 97763 Orthotic/Prosthetic subsequent, energy conservation, coping strategies training, patient/family education, and DME and/or AE instructions  RECOMMENDED OTHER SERVICES: N/A for this visit (receives aquatic therapy at another clinic)  CONSULTED AND AGREED WITH PLAN OF CARE: Patient  PLAN FOR NEXT SESSION:  Check on positioning changes and comfort Check on effectiveness of Ktape RUE & LUE and Video Shoulder taping if desired for home carryover  Energy Conservation Check on HEP ideas - resistance foam vs putty, coordination activities etc  Clarita LITTIE Pride, OT 01/03/2024, 10:16 AM           "

## 2024-01-03 NOTE — Patient Instructions (Addendum)
"  °  Access Code: [email or web address] URL: [email or web address] Date: 01/03/2024 Prepared by: Clarita Pride  Exercises - Putty Squeezes  - 1-2 x daily - 10 reps - Rolling Putty on Table  - 1-2 x daily - 10 reps - Finger Pinch and Pull with Putty  - 1-2 x daily - 10 reps - Key Pinch with Putty  - 1-2 x daily - 10 reps - Removing Marbles from Putty  - 1-2 x daily - 10 reps  "

## 2024-01-04 ENCOUNTER — Ambulatory Visit: Admitting: Occupational Therapy

## 2024-01-09 ENCOUNTER — Encounter: Payer: Self-pay | Admitting: Adult Health

## 2024-01-09 ENCOUNTER — Inpatient Hospital Stay: Payer: BC Managed Care – PPO | Attending: Adult Health | Admitting: Adult Health

## 2024-01-09 VITALS — BP 117/66 | HR 80 | Temp 97.7°F | Resp 18 | Ht 67.0 in | Wt 176.0 lb

## 2024-01-09 DIAGNOSIS — Z808 Family history of malignant neoplasm of other organs or systems: Secondary | ICD-10-CM | POA: Diagnosis not present

## 2024-01-09 DIAGNOSIS — Z803 Family history of malignant neoplasm of breast: Secondary | ICD-10-CM | POA: Insufficient documentation

## 2024-01-09 DIAGNOSIS — Z85820 Personal history of malignant melanoma of skin: Secondary | ICD-10-CM | POA: Insufficient documentation

## 2024-01-09 DIAGNOSIS — Z9189 Other specified personal risk factors, not elsewhere classified: Secondary | ICD-10-CM | POA: Insufficient documentation

## 2024-01-09 DIAGNOSIS — I890 Lymphedema, not elsewhere classified: Secondary | ICD-10-CM | POA: Diagnosis present

## 2024-01-09 NOTE — Progress Notes (Signed)
 [facility] Cancer Follow up:    Patricia Darice CROME, MD [address] [place] [address]   DIAGNOSIS: At High risk for breast cancer   SUMMARY OF HIGH RISK HISTORY: 10-year risk of breast cancer: 3.8% (average risk 1.4%) Lifetime risk of breast cancer: 27.6% (average risk 11%) Risk Reduction: Tamoxifen not recommended by Dr. Gudena; non pharmacologic interventions reviewed Intesnified Screening: Risks and benefits of mammogram alternating with breast MRI reviewed along with alternative of contrast enhanced mammogram.  CURRENT THERAPY: observation  INTERVAL HISTORY:  Discussed the use of AI scribe software for clinical note transcription with the patient, who gave verbal consent to proceed.  History of Present Illness Patricia Blevins is a 38 year old with lymphedema secondary to prior lymph node removal for melanoma who presents for follow-up regarding elevated breast cancer risk and recent breast imaging.  In November, they underwent screening mammography that initially showed an abnormality requiring additional imaging, with follow-up studies unremarkable. They have no breast pain, masses, skin changes, or other new breast symptoms.  They are considering breast reduction surgery and possible gender-affirming procedures. They are concerned about postoperative infection and worsening lymphedema due to prior lymph node removal from a previous melanoma surgery.  They are concerned about the logistics of breast MRI and are interested in contrast-enhanced mammography as an alternative for future high-risk breast imaging.     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  MEDICAL HISTORY: Past Medical History:  Diagnosis Date   Anxiety    Asthma    Depression    Dermatomyositis (HCC)    Family history of breast cancer    Family history of melanoma    Long COVID    Melanoma (HCC)    Pneumonia    Substance abuse (HCC)    No drink since October    SURGICAL HISTORY: Past Surgical History:  Procedure Laterality Date   CYSTOSCOPY N/A 12/29/2020   Procedure: CYSTOSCOPY;  Surgeon: Bettina Muskrat, MD;  Location: [facility] OR;  Service: Gynecology;  Laterality: N/A;   lymph node removal     melanoma removal  2008-2009   SKIN BIOPSY  2021   Between breast for psoriasis   THIGH / KNEE SOFT TISSUE BIOPSY Right 2000   TOTAL LAPAROSCOPIC HYSTERECTOMY WITH SALPINGECTOMY N/A 12/29/2020   Procedure: TOTAL LAPAROSCOPIC HYSTERECTOMY WITH BILATERAL SALPINGECTOMY;  Surgeon: Bettina Muskrat, MD;  Location: [facility] OR;  Service: Gynecology;  Laterality: N/A;    SOCIAL HISTORY: Social History   Socioeconomic History   Marital status: Single    Spouse name: Not on file   Number of children: 0  Years of education: Not on file   Highest education level: Not on file  Occupational History   Not on file  Tobacco Use   Smoking status: Never   Smokeless tobacco: Never  Vaping Use   Vaping status: Never Used  Substance and Sexual Activity   Alcohol  use: Not Currently   Drug use: Yes    Types: Marijuana    Comment: THC gummies   Sexual activity: Not on file  Other Topics Concern   Not on file  Social History Narrative   Not on file   Social Drivers of Health   Tobacco Use: Low Risk (01/09/2024)   Patient History    Smoking Tobacco Use: Never    Smokeless Tobacco Use: Never    Passive Exposure: Not on file  Financial Resource Strain: Not on file  Food Insecurity: Not on file  Transportation Needs: Not on file  Physical Activity: Not on file  Stress: Not on file  Social Connections: Not on file  Intimate Partner Violence: Not on file  Depression (PHQ2-9): Medium Risk (01/09/2024)   Depression (PHQ2-9)    PHQ-2 Score: 7  Alcohol Screen: Not on file  Housing: Not on file  Utilities: Not on file  Health Literacy: Not on file    FAMILY HISTORY: Family History  Problem Relation Age of Onset   Arthritis Mother    Hypertension Mother    Breast cancer Mother 41   Arthritis Father    Hypertension Brother    Melanoma Brother 69   Breast cancer Paternal Aunt 44       father's maternal half sister   Arthritis Maternal Grandmother    Stroke Maternal Grandmother    Arthritis Maternal Grandfather    Melanoma Maternal Grandfather    Diabetes Paternal Grandmother    Breast cancer Paternal Grandmother 57   Heart disease Paternal Grandfather    Colon cancer Neg Hx    Esophageal cancer Neg Hx     Review of Systems  Constitutional:  Negative for appetite change, chills, fatigue, fever and unexpected weight change.  HENT:   Negative for hearing loss, lump/mass and trouble swallowing.   Eyes:  Negative for eye problems and icterus.  Respiratory:  Negative for chest tightness, cough and shortness of breath.   Cardiovascular:  Negative for chest pain, leg swelling and palpitations.  Gastrointestinal:  Negative for abdominal distention, abdominal pain, constipation, diarrhea, nausea and vomiting.  Endocrine: Negative for hot  flashes.  Genitourinary:  Negative for difficulty urinating.   Musculoskeletal:  Negative for arthralgias.  Skin:  Negative for itching and rash.  Neurological:  Negative for dizziness, extremity weakness, headaches and numbness.  Hematological:  Negative for adenopathy. Does not bruise/bleed easily.  Psychiatric/Behavioral:  Negative for depression. The patient is not nervous/anxious.     PHYSICAL EXAMINATION Vitals:   01/09/24 1111  BP: 117/66  Pulse: 80  Resp: 18  Temp: 97.7 F (36.5 C)  SpO2: 100%  Physical Exam Constitutional:      General: Arelis is not in acute distress.    Appearance: Normal appearance. Duchess is not toxic-appearing.  HENT:     Head: Normocephalic and atraumatic.     Mouth/Throat:     Mouth: Mucous membranes are moist.     Pharynx: Oropharynx is clear. No oropharyngeal exudate or posterior oropharyngeal erythema.  Eyes:     General: No scleral icterus. Cardiovascular:     Rate and Rhythm: Normal rate and regular rhythm.     Pulses: Normal  pulses.     Heart sounds: Normal heart sounds.  Pulmonary:     Effort: Pulmonary effort is normal.     Breath sounds: Normal breath sounds.  Chest:     Comments: Bilateral breast exam benign Abdominal:     General: Abdomen is flat. Bowel sounds are normal. There is no distension.     Palpations: Abdomen is soft.     Tenderness: There is no abdominal tenderness.  Musculoskeletal:        General: No swelling.     Cervical back: Neck supple.  Lymphadenopathy:     Cervical: No cervical adenopathy.     Upper Body:     Right upper body: No supraclavicular or axillary adenopathy.     Left upper body: No supraclavicular or axillary adenopathy.  Skin:    General: Skin is warm and dry.     Findings: No rash.  Neurological:     General: No focal deficit present.     Mental Status: Ytzel is alert.  Psychiatric:        Mood and Affect: Mood normal.        Behavior: Behavior normal.       ASSESSMENT and THERAPY  PLAN:    Assessment and Plan Assessment & Plan High risk for breast cancer Mammogram in 11/2023 negative. Considering breast reduction for gender-affirming care. Surveillance tailored to risk profile and preferences. Lymphedema risk not increased by surgery.  - Referred to Dr. Arelia for additional plastic surgery opinion, supplementing referral to Dr. Estefana Fritter. - Discussed logistical challenges of breast MRI with their current physical limitations; recommended contrast-enhanced mammography. - Will consult [facility] radiology colleagues on timing for next contrast-enhanced mammogram, considering potential surgery. - Advised that bilateral mastectomies will limit surveillance to annual clinical breast exams which can be performed by primary care, gynecology, or oncology. - If no surgery, continue annual contrast-enhanced mammography. - Scheduled follow-up in one year, with option to adjust based on surgical decisions.  All questions were answered. The patient knows to call the clinic with any problems, questions or concerns. We can certainly see the patient much sooner if necessary.  Total encounter time:30 minutes*in face-to-face visit time, chart review, lab review, care coordination, order entry, and documentation of the encounter time.    Morna Kendall, NP 01/09/2024 11:26 AM Medical Oncology and Hematology [facility] [address] [place], [place] [address] Tel. [phone]    Fax. [phone]  *Total Encounter Time as defined by the Centers for Medicare and Medicaid Services includes, in addition to the face-to-face time of a patient visit (documented in the note above) non-face-to-face time: obtaining and reviewing outside history, ordering and reviewing medications, tests or procedures, care coordination (communications with other health care professionals or caregivers) and documentation in the medical record.

## 2024-01-10 ENCOUNTER — Ambulatory Visit: Admitting: Occupational Therapy

## 2024-01-10 NOTE — Addendum Note (Signed)
 Addended by: CRAWFORD JACOBSEN C on: 01/10/2024 01:05 PM   Modules accepted: Orders

## 2024-01-11 ENCOUNTER — Ambulatory Visit: Admitting: Occupational Therapy

## 2024-01-11 DIAGNOSIS — R29898 Other symptoms and signs involving the musculoskeletal system: Secondary | ICD-10-CM

## 2024-01-11 DIAGNOSIS — M6281 Muscle weakness (generalized): Secondary | ICD-10-CM | POA: Diagnosis not present

## 2024-01-11 DIAGNOSIS — R278 Other lack of coordination: Secondary | ICD-10-CM

## 2024-01-11 DIAGNOSIS — M25511 Pain in right shoulder: Secondary | ICD-10-CM

## 2024-01-11 DIAGNOSIS — R29818 Other symptoms and signs involving the nervous system: Secondary | ICD-10-CM

## 2024-01-11 NOTE — Patient Instructions (Addendum)
 SABRA

## 2024-01-11 NOTE — Therapy (Signed)
 " OUTPATIENT OCCUPATIONAL THERAPY NEURO TREATMENT  Patient Name: Patricia Blevins MRN: [id number] DOB:1985-08-16, 38 y.o., adult Today's Date: 01/11/2024  PCP: [name], MD  REFERRING PROVIDER: [name], MD  END OF SESSION:  OT End of Session - 01/11/24 0941     Visit Number 4    Number of Visits 17    Date for Recertification  02/16/24    Authorization Type [employer] [place] [employer]    OT Start Time 0940    OT Stop Time 1055    OT Time Calculation (min) 75 min    Equipment Utilized During Treatment Pen grips, FM objects, K tape    Activity Tolerance Patient tolerated treatment well    Behavior During Therapy WFL for tasks assessed/performed         Past Medical History:  Diagnosis Date   Anxiety    Asthma    Depression    Dermatomyositis (HCC)    Family history of breast cancer    Family history of melanoma    Long COVID    Melanoma (HCC)    Pneumonia    Substance abuse (HCC)    No drink since October   Past Surgical History:  Procedure Laterality Date   CYSTOSCOPY N/A 12/29/2020   Procedure: CYSTOSCOPY;  Surgeon: Bettina Muskrat, MD;  Location: [facility] OR;  Service: Gynecology;  Laterality: N/A;   lymph node removal     melanoma removal  2008-2009   SKIN BIOPSY  2021   Between breast for psoriasis   THIGH / KNEE SOFT TISSUE BIOPSY Right 2000   TOTAL LAPAROSCOPIC HYSTERECTOMY WITH SALPINGECTOMY N/A 12/29/2020   Procedure: TOTAL LAPAROSCOPIC HYSTERECTOMY WITH BILATERAL SALPINGECTOMY;  Surgeon: Bettina Muskrat, MD;  Location: [facility] OR;  Service: Gynecology;  Laterality: N/A;   Patient Active Problem List   Diagnosis Date Noted   Genetic testing 03/18/2023   Family history of breast cancer    Family history of melanoma    Abnormal uterine bleeding 12/29/2020   S/P laparoscopic hysterectomy 12/29/2020   Vitamin D  deficiency 01/09/2019   Elevated BP without diagnosis of hypertension 01/09/2019   Taking multiple medications for chronic disease 01/09/2019   Acute  upper respiratory infection 10/05/2017   Left otitis media 10/05/2017   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   Long-term use of immunosuppressant medication 03/05/2016   Multiple benign nevi of upper extremity, lower extremity, and trunk 03/05/2016   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/05/2016   Candidiasis 01/22/2016   History of melanoma 09/12/2015   Lymphedema of upper extremity following lymphadenectomy 07/31/2015   Leg weakness, bilateral 07/31/2015   Metastatic melanoma (HCC) 12/22/2010   PM (polymyositis) (HCC) 12/22/2010   ONSET DATE: 11/28/2023 (Date of referral)  REFERRING DIAG: 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  They are very interested in trying it again. Pt reports having bought some self opening scissors and reports that they are very helpful.  Pt also has enjoyed working with the putty.  Pt accompanied by: self  PERTINENT HISTORY: PMH: anxiety, asthma, depression, dermatomyositis/polymyositis, melanoma, hx of substance abuse, lymphedema of RUE, long COVID, and hysterectomy  PRECAUTIONS:  Fall  WEIGHT BEARING RESTRICTIONS: No  PAIN:  Are you having pain? Yes: NPRS scale: 3/10 Pain location: low back and shoulders Pain description: discomfort Aggravating factors: prolonged use  Relieving factors: stretching, heat Repetitive, prolonged activities usually result in their arms giving out.  FALLS: Has patient fallen in last 6 months? No  LIVING ENVIRONMENT: 2 story home Lives with parents, ramp to enter, adaptive vehicle to  ride in  Rollator, power chair, stair lift for getting upstairs, renovated bathroom widened door has a manual chair upstairs, uses a cane to get into bathroom, walk in shower, grab bars, toilet lift, standing in shower because unable to stand up from sitting without assistance Mechanical bed, SPC, rollator walker, powerchair    Handheld shower head, long handled sponge, reacher, bidet; table arm supports; shoulder support  PLOF: Independent with basic ADLs and Needs assistance with homemaking; on disability; does not drive  PATIENT GOALS: Improve use of hands/hand pain/continue with leisure activities Subjective note at eval - They were recommended to come into this clinic to work on issues with their hands. They like to paint and want to be able to continue with this. Is looking to build an accessible home in [place]. They question what recommendations therapy has for this. Is showering once a week but would like to do this more frequently. Showering is so exhausting to the point where it will be the only thing they do for the day. Texts throughout the day and worries this may be detrimental to their hands.   OBJECTIVE:  Note: Objective measures were completed at Evaluation unless otherwise noted.  HAND DOMINANCE: Right  ADLs: Overall ADLs: mod I  IADLs: Medication management: occasional setup Financial management: mod I Handwriting: 100% legible, Increased time, and unable to write anything more than a sentence without fatigue  MOBILITY STATUS: I with power wheelchair  ACTIVITY TOLERANCE: Activity tolerance: poor  FUNCTIONAL OUTCOME MEASURES: PSFS:  Total score = sum of the activity scores/number of activities Minimum detectable change (90%CI) for average score = 2 points Minimum detectable change (90%CI) for single activity score = 3 points   UPPER EXTREMITY ROM:  70* shoulder flexion B; all other planes WFL for elbows, wrists, digits  UPPER EXTREMITY MMT:   BUE -  BFL  HAND FUNCTION: 12/27/23 - Right 41.6, 40.1, 41.2 Left 54.2, 59.7, 55.7 Average: Right: 41.0 lbs Left: 56.5 lbs  COORDINATION: 12/27/23 - Right 26.18 sec Left 28.30 sec   SENSATION: Paresthesias reported in RUE  EDEMA: none reported or observed  MUSCLE TONE: N/T  COGNITION: Overall cognitive status: Within functional limits for tasks assessed  VISION: Subjective report: no changes reported Baseline vision: Wears glasses all the time Visual history: none  VISION ASSESSMENT: WFL  PERCEPTION: WFL  PRAXIS: WFL  OBSERVATIONS: Pt independent with use of power wheelchair. The pt appears well kept and has glasses donned. Wrist positioning strays from neutral with functional use.  TODAY'S TREATMENT:    - Therapeutic activities completed for duration as noted below including:  Coordination Exercise/Activity handout with images provided for various activities to work on B UE finger ROM, dexterity and isolated movements with demonstration and practice, as well as modification, hand over hand guidance and cues throughout to improve technique, digital isolation and ease of performing task.  Tasks included:  Pick up coins, dominoes, buttons, marbles, dried beans/pasta of different sizes ... To place in containers To stack - with guidance to work on include/isolate specific fingers. To pick up items one at a time until patient got 5+ in their hand and then move item from palm to fingertips to release ie) Finger-to-palm then palm-to-finger translation of small items - Options to vary difficulty include using a washcloth under items like coins or using larger items (checkers vs coins or blocks/dominos vs dice) for increased ease of picking up items.  Shuffling, Flipping and dealing cards 1 at a time. -- Setup patient to work on sorting cards, focusing on using index  finger with thumb to flip cards or holding deck of cards in palm of hand and push off 1 card at a time from the top of the deck using only thumb OT educated pt on table top play of Golf Solitaire for BUE to address fine motor coordination, gross motor coordination, upper extremity range of motion, scanning and locating of items, processing, bimanual coordination/trunk control, sequencing of unfamiliar motor movements or tasks, motor planning, item/pattern recognition, and endurance/stamina. Pt required minimal cues for proper play.     Rotate golf balls (clockwise and counter-clockwise) with forearm pronated and balls on table or supinated and balls in hand.   Twirl pen/cil between fingers. - Encouragement to isolate fingers individually and twirl (rotation) or flipping and shift up and down the pen (translation) to get it in position for writing or erasing.   Pt also engaged in writing activities with various pen options with most success with using beige built up foam handle on slightly thicker ball point pen verus felt or flair pen.    Tear a piece of paper towel and roll it into small balls with fingertips ie) straw wrapping when eating out.    Patient is encouraged to take breaks, relax arm/shoulder by supporting forearm, minimize compensatory motions and a try different activities throughout the day/week including games like Londa (for the dice), card games, Connect 4 etc.   Patient benefited from extra time, verbal/tactile cues, and modeling of task to allow time for processing of verbal instructions and improve motor planning of unfamiliar movements.    Neuromuscular reeducation - provided via Kinesio taping to support the neuromuscular system and promote functional positioning and movement of R and L UE for improved shoulder alignment and support to decrease discomfort. Kinesio Taping (KT) technique applied to both shoulders today to address glenohumeral instability. Tape applied using  mechanical correction and space correction techniques with moderate tension to facilitate humeral head approximation toward the glenoid fossa, provide external support to limit downward and forward displacement due to gravity and muscle weakness and improve proprioceptive input for posture and positioning during seated activities. Same taping pattern used from last week - Middle deltoid strip for stabilization, posterior deltoid strip with 30-50% tension to support shoulder approximation and anterior deltoid strip for superior shoulder capsule positioning.  Patient tolerated application well and reported good comfort prior to leaving therapy.  Rock Tape used for PROGRESS ENERGY (no additional skin prep) and beige Kinseio tape with LUE (  skin prep under posterior and anterior strips for shoulder positioning but not on middle stabilization tape)    PATIENT EDUCATION: Education details: Coordination Activities; Ktape trial Person educated: Patient Education method: Programmer, Multimedia, Demonstration, Actor cues, Verbal cues, and Handouts Education comprehension: verbalized understanding, returned demonstration, verbal cues required, tactile cues required, and needs further education  HOME EXERCISE PROGRAM: 12/27/23: Bed positioning, Ktape trial 01/03/24: Tendon Glides/Putty HEP Access Code: [email or web address] 01/11/24: Coordination Activities  GOALS:  SHORT TERM GOALS: Target date: 01/18/2024    Patient will demonstrate initial UE HEP with 25% verbal cues or less for proper execution. Baseline: Goal status: IN Progress  2.  Patient will demonstrate at least 45+ lbs dominant RUE grip strength as needed to open jars and other containers. Baseline: Right: 41.0 lbs Left: 56.5 lbs Goal status: IN Progress  3.  Patient will demo improved FM coordination as evidenced by completing nine-hole peg with use of BUE in 25 seconds or less. Baseline: Right 26.18 sec Left 28.30 sec  Goal status: IN Progress  4.  Pt will  independently recall at least 3 joint protection, ergonomics, and body mechanic principles as noted in pt instructions.   Baseline:  Goal status: IN Progress  5.  Patient will independently recall at least 3 energy conservation principles in relation to ADLs to increase functional independence.  Baseline:  Goal status: IN Progress   LONG TERM GOALS: Target date: 02/16/2024  Patient will demonstrate updated UE HEP with visual handouts only for proper execution. Baseline:  Goal status: INITIAL  2.  Patient will report at least two-point increase in average PSFS score or at least three-point increase in a single activity score indicating functionally significant improvement given minimum detectable change.  Baseline: 3.7 total score (See above for individual activity scores)  Goal status: INITIAL  ASSESSMENT:  CLINICAL IMPRESSION: Patient is a 38 y.o. adult who was seen today for occupational therapy treatment for polymyositis with B hand/shoulder pain and weakness R>L.  Provided education re: UE coordination activities and additional UE support with K tape to determine most appropriate options for carryover at home. Pt would benefit from skilled OT services in the outpatient setting to work on impairments as noted below to help pt maximize functional daily activities. PERFORMANCE DEFICITS: in functional skills including ADLs, IADLs, coordination, ROM, strength, pain, Fine motor control, Gross motor control, mobility, body mechanics, endurance, decreased knowledge of use of DME, and UE functional use.   IMPAIRMENTS: are limiting patient from ADLs, IADLs, and leisure.   CO-MORBIDITIES: may have co-morbidities  that affects occupational performance. Patient will benefit from skilled OT to address above impairments and improve overall function.  REHAB POTENTIAL: Good  PLAN:  OT FREQUENCY: 2x/week  OT DURATION: 8 weeks  PLANNED INTERVENTIONS: 97168 OT Re-evaluation, 97535 self care/ADL  training, 02889 therapeutic exercise, 97530 therapeutic activity, 97112 neuromuscular re-education, 97140 manual therapy, 97035 ultrasound, 97018 paraffin, 02960 fluidotherapy, 97010 moist heat, 97760 Orthotic Initial, 97763 Orthotic/Prosthetic subsequent, energy conservation, coping strategies training, patient/family education, and DME and/or AE instructions  RECOMMENDED OTHER SERVICES: N/A for this visit (receives aquatic therapy at another clinic)  CONSULTED AND AGREED WITH PLAN OF CARE: Patient  PLAN FOR NEXT SESSION:  Check on positioning changes and comfort Check on effectiveness of Ktape RUE & LUE and Video Shoulder taping if desired for home carryover  Energy Conservation Check on HEPs - resistance foam, putty, coordination activities etc  Clarita LITTIE Pride, OT 01/11/2024, 9:42 AM  "

## 2024-01-19 ENCOUNTER — Ambulatory Visit: Attending: Family Medicine | Admitting: Occupational Therapy

## 2024-01-19 DIAGNOSIS — R278 Other lack of coordination: Secondary | ICD-10-CM | POA: Diagnosis present

## 2024-01-19 DIAGNOSIS — R29898 Other symptoms and signs involving the musculoskeletal system: Secondary | ICD-10-CM | POA: Insufficient documentation

## 2024-01-19 DIAGNOSIS — R29818 Other symptoms and signs involving the nervous system: Secondary | ICD-10-CM | POA: Diagnosis present

## 2024-01-19 DIAGNOSIS — M79641 Pain in right hand: Secondary | ICD-10-CM | POA: Diagnosis present

## 2024-01-19 DIAGNOSIS — R293 Abnormal posture: Secondary | ICD-10-CM | POA: Diagnosis present

## 2024-01-19 DIAGNOSIS — M25511 Pain in right shoulder: Secondary | ICD-10-CM | POA: Insufficient documentation

## 2024-01-19 DIAGNOSIS — M6281 Muscle weakness (generalized): Secondary | ICD-10-CM | POA: Insufficient documentation

## 2024-01-19 NOTE — Patient Instructions (Signed)
 Patricia Blevins

## 2024-01-19 NOTE — Therapy (Signed)
 " OUTPATIENT OCCUPATIONAL THERAPY NEURO TREATMENT  Patient Name: Patricia Blevins MRN: [id number] DOB:08-21-1985, 39 y.o., adult Today's Date: 01/19/2024  PCP: [name], MD  REFERRING PROVIDER: [name], MD  END OF SESSION:  OT End of Session - 01/19/24 1021     Visit Number 5    Number of Visits 17    Date for Recertification  02/16/24    Authorization Type [employer]    OT Start Time 1020    OT Stop Time 1130    OT Time Calculation (min) 70 min    Activity Tolerance Patient tolerated treatment well    Behavior During Therapy WFL for tasks assessed/performed         Past Medical History:  Diagnosis Date   Anxiety    Asthma    Depression    Dermatomyositis (HCC)    Family history of breast cancer    Family history of melanoma    Long COVID    Melanoma (HCC)    Pneumonia    Substance abuse (HCC)    No drink since October   Past Surgical History:  Procedure Laterality Date   CYSTOSCOPY N/A 12/29/2020   Procedure: CYSTOSCOPY;  Surgeon: Bettina Muskrat, MD;  Location: [facility] OR;  Service: Gynecology;  Laterality: N/A;   lymph node removal     melanoma removal  2008-2009   SKIN BIOPSY  2021   Between breast for psoriasis   THIGH / KNEE SOFT TISSUE BIOPSY Right 2000   TOTAL LAPAROSCOPIC HYSTERECTOMY WITH SALPINGECTOMY N/A 12/29/2020   Procedure: TOTAL LAPAROSCOPIC HYSTERECTOMY WITH BILATERAL SALPINGECTOMY;  Surgeon: Bettina Muskrat, MD;  Location: [facility] OR;  Service: Gynecology;  Laterality: N/A;   Patient Active Problem List   Diagnosis Date Noted   Genetic testing 03/18/2023   Family history of breast cancer    Family history of melanoma    Abnormal uterine bleeding 12/29/2020   S/P laparoscopic hysterectomy 12/29/2020   Vitamin D  deficiency 01/09/2019   Elevated BP without diagnosis of hypertension 01/09/2019   Taking multiple medications for chronic disease 01/09/2019   Acute upper respiratory infection 10/05/2017   Left otitis media 10/05/2017    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   Long-term use of immunosuppressant medication 03/05/2016   Multiple benign nevi of upper extremity, lower extremity, and trunk 03/05/2016   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/05/2016   Candidiasis 01/22/2016   History of melanoma 09/12/2015   Lymphedema of upper extremity following lymphadenectomy 07/31/2015   Leg weakness, bilateral 07/31/2015   Metastatic melanoma (HCC) 12/22/2010   PM (polymyositis) (HCC) 12/22/2010   ONSET DATE: 11/28/2023 (Date of referral)  REFERRING DIAG: Polymyositis M33.20   THERAPY DIAG:  Muscle weakness (generalized)  Other lack of coordination  Right shoulder pain, unspecified chronicity  Other symptoms and signs involving the nervous system  Rationale for Evaluation and Treatment: Rehabilitation  SUBJECTIVE:   SUBJECTIVE STATEMENT:  Pt reported great comfort with Ktape again without much difference noted between the 2 types trialed, but they noticed a big difference when it was removed again.  They are very interested in doing a video to help their dad apply it for them.  They also reported desire to get info re: home accessibility to help with family plans for constructing a new home in the near future.  Pt accompanied by: self  PERTINENT HISTORY: PMH: anxiety, asthma, depression, dermatomyositis/polymyositis, melanoma, hx of substance abuse, lymphedema of RUE, long COVID, and hysterectomy  PRECAUTIONS: Fall  WEIGHT BEARING RESTRICTIONS: No  PAIN:  Are you having pain? Yes: NPRS scale: 3/10 Pain location: low back and shoulders Pain description: discomfort Aggravating factors: prolonged use  Relieving factors: stretching, heat Repetitive, prolonged activities usually result in their arms giving out.  FALLS: Has patient fallen in last 6 months? No  LIVING ENVIRONMENT: 2 story home Lives with parents, ramp to enter, adaptive vehicle to ride in  Rollator, power chair, stair lift for getting  upstairs, renovated bathroom widened door has a manual chair upstairs, uses a cane to get into bathroom, walk in shower, grab bars, toilet lift, standing in shower because unable to stand up from sitting without assistance Mechanical bed, SPC, rollator walker, powerchair    Handheld shower head, long handled sponge, reacher, bidet; table arm supports; shoulder support  PLOF: Independent with basic ADLs and Needs assistance with homemaking; on disability; does not drive  PATIENT GOALS: Improve use of hands/hand pain/continue with leisure activities Subjective note at eval - They were recommended to come into this clinic to work on issues with their hands. They like to paint and want to be able to continue with this. Is looking to build an accessible home in [place]. They question what recommendations therapy has for this. Is showering once a week but would like to do this more frequently. Showering is so exhausting to the point where it will be the only thing they do for the day. Texts throughout the day and worries this may be detrimental to their hands.   OBJECTIVE:  Note: Objective measures were completed at Evaluation unless otherwise noted.  HAND DOMINANCE: Right  ADLs: Overall ADLs: mod I  IADLs: Medication management: occasional setup Financial management: mod I Handwriting: 100% legible, Increased time, and unable to write anything more than a sentence without fatigue  MOBILITY STATUS: I with power wheelchair  ACTIVITY TOLERANCE: Activity tolerance: poor  FUNCTIONAL OUTCOME MEASURES: PSFS:  Total score = sum of the activity scores/number of activities Minimum detectable change (90%CI) for average score = 2 points Minimum detectable change (90%CI) for single activity score = 3 points   UPPER EXTREMITY ROM:  70* shoulder flexion B; all other planes WFL for elbows, wrists, digits  UPPER EXTREMITY MMT:   BUE - BFL  HAND FUNCTION: 12/27/23 - Right 41.6, 40.1, 41.2 Left  54.2, 59.7, 55.7 Average: Right: 41.0 lbs Left: 56.5 lbs  COORDINATION: 12/27/23 - Right 26.18 sec Left 28.30 sec   SENSATION: Paresthesias reported in RUE  EDEMA: none reported or observed  MUSCLE TONE: N/T  COGNITION: Overall cognitive status: Within functional limits for tasks assessed  VISION: Subjective report: no changes reported Baseline vision: Wears glasses all the time Visual history: none  VISION ASSESSMENT: WFL  PERCEPTION: WFL  PRAXIS: WFL  OBSERVATIONS: Pt independent with use of power wheelchair. The pt appears well kept and has glasses donned. Wrist positioning strays from neutral with functional use.  TODAY'S TREATMENT:    - Self Care education and training completed for duration as noted below including:  OT educated pt on joint protection principles as noted in pt instructions as needed to improve UE pain.   Handout is provided regarding joint protection and patient is encouraged to consider the specific acronym LESS ie) less strain on joints  L: Listen to your body E: Energy Conservation S: Stronger Joints take the Lead S: Strategize   Patient encouraged to protect hands and wrist by  - Respecting for Pain and stopping activities before they reach the point of discomfort or pain  - Rest and Work Balance ie) balancing activities with appropriate rests during activity - Reduction of Effort - Use two hands instead of one if possible  - Use of Larger/Stronger Joints Ie) Lift or carry with the forearm or shoulder rather than fingers - Avoid Activities That Cannot Be Stopped - Use of Assistive Equipment - Consider splint use and AE equipment to protect joints from deformity and stresses Then reviewed activities that can be modified with adaptive equipment and encourage patient to look adaptive equipment for arthritis [i.e. to  consider joint protection].  OT educated patient on the 4 Ps of energy conservation including positioning, pace, prioritizing, and planning to maximize efficiency and safety with completion of functional activities as desired.  Patient verbalized understanding of all information provided and was given a packet to take home for review.  Educated pt in [place] application with provision of strips to try at home via simple online instructions at https://www.handtherapyacademy.com/treatments/how-to-use-kinesiology-taping-for-shoulder-subluxation/ with encouragement to have it on for next appt to assess carryover.  Initiated ADL education regarding home modification considerations to improve safety and independence. Bathroom setup was reviewed, as the patient currently uses a Gentle Boost shower seat, which limits ability to place feet on the floor, making forward reach and foot hygiene difficult. Adaptive equipment options were demonstrated, including various reachers, a foot stool, and long-handled sponges to assist with washing feet and toes.  Additional education was provided regarding lower-body dressing adaptations, including use of a foot funnel, as well as kitchen task modifications such as use of a pan buddy for safer pot handling. General home layout considerations were discussed, including potential benefits of an [place] with varied counter heights (if modifications are pursued) and efficient placement of the sink relative to the stove and refrigerator to minimize unnecessary mobility demands.  Patient was encouraged to consider modifications that continue to support use of the rollator for mobility to maintain this skill. It was noted that the patient is currently unable to sit on the rollator seat due to difficulty standing from that height.   PATIENT EDUCATION: Education details: Primary school teacher, Energy conservation Person educated: Patient Education method: Explanation, Demonstration,  Tactile cues, Verbal cues, and Handouts Education comprehension: verbalized understanding, returned demonstration, verbal cues required, tactile cues required, and needs further education  HOME EXERCISE PROGRAM: 12/27/23: Bed positioning, Ktape trial 01/03/24: Tendon Glides/Putty HEP Access Code: [email or web address] 01/11/24: Coordination Activities 01/19/24: Joint protection, Energy Conservation, AE considerations  GOALS:  SHORT TERM GOALS: Target date: 01/18/2024    Patient will demonstrate initial UE HEP with 25% verbal cues or less for proper execution. Baseline: Goal status: IN Progress  2.  Patient will demonstrate at least 45+ lbs dominant RUE grip strength as needed to open jars and other containers. Baseline: Right: 41.0 lbs Left: 56.5 lbs Goal status: IN Progress  3.  Patient will demo improved FM coordination as evidenced by completing nine-hole  peg with use of BUE in 25 seconds or less. Baseline: Right 26.18 sec Left 28.30 sec  Goal status: IN Progress  4.  Pt will independently recall at least 3 joint protection, ergonomics, and body mechanic principles as noted in pt instructions.   Baseline:  Goal status: IN Progress  5.  Patient will independently recall at least 3 energy conservation principles in relation to ADLs to increase functional independence.  Baseline:  Goal status: IN Progress   LONG TERM GOALS: Target date: 02/16/2024  Patient will demonstrate updated UE HEP with visual handouts only for proper execution. Baseline:  Goal status: IN Progress  2.  Patient will report at least two-point increase in average PSFS score or at least three-point increase in a single activity score indicating functionally significant improvement given minimum detectable change.  Baseline: 3.7 total score (See above for individual activity scores)  Goal status: INITIAL  ASSESSMENT:  CLINICAL IMPRESSION: Patient is a 39 y.o. adult who was seen today for occupational therapy treatment  for polymyositis with B hand/shoulder pain and weakness R>L.  Provided education re: joint protection, energy conservation, adaptive equipment and home modifications with additional UE support with K tape for appropriate carryover at home. Pt will benefit from further skilled OT services in the outpatient setting to work on impairments as noted below to help pt maximize functional daily activities.  Pt to take pictures of home setup to help with problem solving.  PERFORMANCE DEFICITS: in functional skills including ADLs, IADLs, coordination, ROM, strength, pain, Fine motor control, Gross motor control, mobility, body mechanics, endurance, decreased knowledge of use of DME, and UE functional use.   IMPAIRMENTS: are limiting patient from ADLs, IADLs, and leisure.   CO-MORBIDITIES: may have co-morbidities  that affects occupational performance. Patient will benefit from skilled OT to address above impairments and improve overall function.  REHAB POTENTIAL: Good  PLAN:  OT FREQUENCY: 2x/week  OT DURATION: 8 weeks  PLANNED INTERVENTIONS: 97168 OT Re-evaluation, 97535 self care/ADL training, 02889 therapeutic exercise, 97530 therapeutic activity, 97112 neuromuscular re-education, 97140 manual therapy, 97035 ultrasound, 97018 paraffin, 02960 fluidotherapy, 97010 moist heat, 97760 Orthotic Initial, 97763 Orthotic/Prosthetic subsequent, energy conservation, coping strategies training, patient/family education, and DME and/or AE instructions  RECOMMENDED OTHER SERVICES: N/A for this visit (receives aquatic therapy at another clinic)  CONSULTED AND AGREED WITH PLAN OF CARE: Patient  PLAN FOR NEXT SESSION:  Check on positioning changes and comfort Check on application of Ktape RUE & LUE and Video Shoulder taping if desired for home carryover  Energy Conservation Check on HEPs - resistance foam, putty, coordination activities etc Home modifications   Patricia Blevins, OT 01/19/2024, 11:54 AM  "

## 2024-01-20 ENCOUNTER — Ambulatory Visit: Admitting: Occupational Therapy

## 2024-01-21 ENCOUNTER — Other Ambulatory Visit: Payer: Self-pay | Admitting: Gastroenterology

## 2024-01-21 DIAGNOSIS — K5904 Chronic idiopathic constipation: Secondary | ICD-10-CM

## 2024-01-24 ENCOUNTER — Ambulatory Visit: Admitting: Occupational Therapy

## 2024-01-26 ENCOUNTER — Ambulatory Visit: Admitting: Occupational Therapy

## 2024-01-31 ENCOUNTER — Ambulatory Visit: Admitting: Occupational Therapy

## 2024-02-01 ENCOUNTER — Ambulatory Visit: Admitting: Occupational Therapy

## 2024-02-02 ENCOUNTER — Ambulatory Visit: Admitting: Occupational Therapy

## 2024-02-02 DIAGNOSIS — R278 Other lack of coordination: Secondary | ICD-10-CM

## 2024-02-02 DIAGNOSIS — M79641 Pain in right hand: Secondary | ICD-10-CM

## 2024-02-02 DIAGNOSIS — R29818 Other symptoms and signs involving the nervous system: Secondary | ICD-10-CM

## 2024-02-02 DIAGNOSIS — M6281 Muscle weakness (generalized): Secondary | ICD-10-CM

## 2024-02-02 DIAGNOSIS — R293 Abnormal posture: Secondary | ICD-10-CM

## 2024-02-02 DIAGNOSIS — M25511 Pain in right shoulder: Secondary | ICD-10-CM

## 2024-02-02 DIAGNOSIS — R29898 Other symptoms and signs involving the musculoskeletal system: Secondary | ICD-10-CM

## 2024-02-02 NOTE — Therapy (Signed)
 " OUTPATIENT OCCUPATIONAL THERAPY NEURO TREATMENT  Patient Name: Patricia Blevins MRN: [id number] DOB:1985-10-24, 39 y.o., adult Today's Date: 02/02/2024  PCP: [name], MD  REFERRING PROVIDER: [name], MD  END OF SESSION:  OT End of Session - 02/02/24 1501     Visit Number 6    Number of Visits 17    Date for Recertification  02/16/24   POC expires this date; auth goes until 3/15   Authorization Type [facility] Medicaid    Authorization Time Period 12/16-3/15/26    Authorization - Visit Number 5    Authorization - Number of Visits 11    OT Start Time 1451    OT Stop Time 1530    OT Time Calculation (min) 39 min    Activity Tolerance Patient tolerated treatment well    Behavior During Therapy [facility] for tasks assessed/performed         Past Medical History:  Diagnosis Date   Anxiety    Asthma    Depression    Dermatomyositis (HCC)    Family history of breast cancer    Family history of melanoma    Long COVID    Melanoma (HCC)    Pneumonia    Substance abuse (HCC)    No drink since October   Past Surgical History:  Procedure Laterality Date   CYSTOSCOPY N/A 12/29/2020   Procedure: CYSTOSCOPY;  Surgeon: Bettina Muskrat, MD;  Location: [facility] OR;  Service: Gynecology;  Laterality: N/A;   lymph node removal     melanoma removal  2008-2009   SKIN BIOPSY  2021   Between breast for psoriasis   THIGH / KNEE SOFT TISSUE BIOPSY Right 2000   TOTAL LAPAROSCOPIC HYSTERECTOMY WITH SALPINGECTOMY N/A 12/29/2020   Procedure: TOTAL LAPAROSCOPIC HYSTERECTOMY WITH BILATERAL SALPINGECTOMY;  Surgeon: Bettina Muskrat, MD;  Location: [facility] OR;  Service: Gynecology;  Laterality: N/A;   Patient Active Problem List   Diagnosis Date Noted   Genetic testing 03/18/2023   Family history of breast cancer    Family history of melanoma    Abnormal uterine bleeding 12/29/2020   S/P laparoscopic hysterectomy 12/29/2020   Vitamin D  deficiency 01/09/2019   Elevated BP without diagnosis of  hypertension 01/09/2019   Taking multiple medications for chronic disease 01/09/2019   Acute upper respiratory infection 10/05/2017   Left otitis media 10/05/2017   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   Long-term use of immunosuppressant medication 03/05/2016   Multiple benign nevi of upper extremity, lower extremity, and trunk 03/05/2016   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/05/2016   Candidiasis 01/22/2016   History of melanoma 09/12/2015   Lymphedema of upper extremity following lymphadenectomy 07/31/2015   Leg weakness, bilateral 07/31/2015   Metastatic melanoma (HCC) 12/22/2010   PM (polymyositis) (HCC) 12/22/2010   ONSET DATE: 11/28/2023 (Date of referral)  REFERRING DIAG: 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   Pt accompanied by: self  PERTINENT HISTORY: PMH: anxiety, asthma, depression, dermatomyositis/polymyositis, melanoma, hx of substance abuse, lymphedema of RUE, long COVID, and hysterectomy  PRECAUTIONS: Fall  WEIGHT BEARING RESTRICTIONS: No  PAIN:  Are you having pain? Yes: NPRS scale: 3/10 Pain location: low back and shoulders Pain description: discomfort Aggravating factors: prolonged use  Relieving factors: stretching, heat Repetitive, prolonged activities usually result in their arms giving out.  FALLS: Has patient fallen in last 6 months? No  LIVING ENVIRONMENT: 2 story home Lives with parents, ramp to enter, adaptive vehicle to ride in  Rollator, power chair, stair lift for getting  upstairs, renovated bathroom widened door has a manual chair upstairs, uses a cane to get into bathroom, walk in shower, grab bars, toilet lift, standing in shower because unable to stand up from sitting without assistance Mechanical bed, SPC, rollator walker, powerchair    Handheld shower head, long handled sponge, reacher, bidet; table arm supports; shoulder support  PLOF: Independent with basic ADLs and Needs assistance with homemaking; on disability; does not drive  PATIENT GOALS: Improve use of hands/hand pain/continue with leisure activities Subjective note at eval - They were recommended to come into this clinic to work on issues with their hands. They like to paint and want to be able to continue with this. Is looking to build an accessible home in [place]. They question what recommendations therapy has for this. Is showering once a week but would like to do this more frequently. Showering is so exhausting to the point where it will be the only thing they do for the day. Texts throughout the day and worries this may be detrimental to their hands.   OBJECTIVE:  Note: Objective measures were completed at Evaluation unless otherwise noted.  HAND DOMINANCE: Right  ADLs: Overall ADLs: mod I  IADLs: Medication management: occasional setup Financial management: mod I Handwriting: 100% legible, Increased time, and unable to write anything more than a sentence without fatigue  MOBILITY STATUS: I with power wheelchair  ACTIVITY TOLERANCE: Activity tolerance: poor  FUNCTIONAL OUTCOME MEASURES: PSFS:  Total score = sum of the activity scores/number of activities Minimum detectable change (90%CI) for average score = 2 points Minimum detectable change (90%CI) for single activity score = 3 points   UPPER EXTREMITY ROM:  70* shoulder flexion B; all other planes WFL for elbows, wrists, digits  UPPER EXTREMITY MMT:   BUE - BFL  HAND FUNCTION: 12/27/23 - Right 41.6, 40.1, 41.2 Left  54.2, 59.7, 55.7 Average: Right: 41.0 lbs Left: 56.5 lbs  COORDINATION: 12/27/23 - Right 26.18 sec Left 28.30 sec   SENSATION: Paresthesias reported in RUE  EDEMA: none reported or observed  MUSCLE TONE: N/T  COGNITION: Overall cognitive status: Within functional limits for tasks assessed  VISION: Subjective report: no changes reported Baseline vision: Wears glasses all the time Visual history: none  VISION ASSESSMENT: WFL  PERCEPTION: WFL  PRAXIS: WFL  OBSERVATIONS: Pt independent with use of power wheelchair. The pt appears well kept and has glasses donned. Wrist positioning strays from neutral with functional use.  TODAY'S TREATMENT:    - Self Care education and training completed for duration as noted below including:  Educated patient on purpose and expected outcomes of using a posture corrector (improved postural awareness and gentle support). Pt donned Comfy Brace Posture Corrector and adjusted straps for proper fit with cueing from therapist. Reviewed recommended wear schedule: begin 5-10 minutes/day, increase gradually as tolerated. Educated on skin checks, avoiding excessive strap tension, and monitoring for discomfort. Patient demonstrated improved upright posture with verbal and tactile cues; able to independently adjust and doff brace after training.  OT educated patient on the 4 Ps of energy conservation including positioning, pace, prioritizing, and planning to maximize efficiency and safety with completion of functional activities as desired.  Patient verbalized understanding of all information provided and was given a packet to take home for review.  PATIENT EDUCATION: Education details: posture correcting brace, Energy conservation Person educated: Patient Education method: Explanation, Demonstration, Tactile cues, Verbal cues, and  Handouts Education comprehension: verbalized understanding, returned demonstration, verbal cues required, tactile cues required, and needs further education  HOME EXERCISE PROGRAM: 12/27/23: Bed positioning, Ktape trial 01/03/24: Tendon Glides/Putty HEP Access Code: [email or web address] 01/11/24: Coordination Activities 01/19/24: Joint protection, Energy Conservation, AE considerations 02/02/2024: additional EC strategies  GOALS:  SHORT TERM GOALS: Target date: 01/18/2024    Patient will demonstrate initial UE HEP with 25% verbal cues or less for proper execution. Baseline: Goal status: IN Progress  2.  Patient will demonstrate at least 45+ lbs dominant RUE grip strength as needed to open jars and other containers. Baseline: Right: 41.0 lbs Left: 56.5 lbs Goal status: IN Progress  3.  Patient will demo improved FM coordination as evidenced by completing nine-hole peg with use of BUE in 25 seconds or less. Baseline: Right 26.18 sec Left 28.30 sec  Goal status: IN Progress  4.  Pt will independently recall at least 3 joint protection, ergonomics, and body mechanic principles as noted in pt instructions.   Baseline:  Goal status: IN Progress  5.  Patient will independently recall at least 3 energy conservation principles in relation to ADLs to increase functional independence.  Baseline:  Goal status: IN Progress   LONG TERM GOALS: Target date: 02/16/2024  Patient will demonstrate updated UE HEP with visual handouts only for proper execution. Baseline:  Goal status: IN Progress  2.  Patient will report at least two-point increase in average PSFS score or at least three-point increase in a single activity score indicating functionally significant improvement given minimum detectable change.  Baseline: 3.7 total score (See above for individual activity scores)  Goal status: INITIAL  ASSESSMENT:  CLINICAL IMPRESSION: Patient is a 39 y.o. adult who was seen today for occupational therapy  treatment for polymyositis with B hand/shoulder pain and weakness R>L.  Pt demonstrating good understanding of posture correcting brace wear with independent donning and doffing. Will continue per POC. PERFORMANCE DEFICITS: in functional skills including ADLs, IADLs, coordination, ROM, strength, pain, Fine motor control, Gross motor control, mobility, body mechanics, endurance, decreased knowledge of use of DME, and UE functional use.   IMPAIRMENTS: are limiting patient from ADLs, IADLs, and leisure.   CO-MORBIDITIES: may have co-morbidities  that affects occupational performance. Patient will benefit from skilled OT to address above impairments and improve overall function.  REHAB POTENTIAL: Good  PLAN:  OT FREQUENCY: 2x/week  OT DURATION: 8 weeks  PLANNED INTERVENTIONS: 97168 OT Re-evaluation, 97535 self care/ADL training, 02889 therapeutic exercise, 97530 therapeutic activity, 97112 neuromuscular re-education, 97140 manual therapy, [id number]  ultrasound, 02981 paraffin, 97039 fluidotherapy, 97010 moist heat, 97760 Orthotic Initial, 97763 Orthotic/Prosthetic subsequent, energy conservation, coping strategies training, patient/family education, and DME and/or AE instructions  RECOMMENDED OTHER SERVICES: N/A for this visit (receives aquatic therapy at another clinic)  CONSULTED AND AGREED WITH PLAN OF CARE: Patient  PLAN FOR NEXT SESSION: Modify brace wearing schedule Check on application of Ktape RUE & LUE and Video   Check on positioning changes and comfort Shoulder taping if desired for home carryover  Energy Conservation Check on HEPs - resistance foam, putty, coordination activities etc Home modifications   Jocelyn CHRISTELLA Bottom, OT 02/02/2024, 4:31 PM  "

## 2024-02-06 ENCOUNTER — Ambulatory Visit: Admitting: Occupational Therapy

## 2024-02-09 ENCOUNTER — Ambulatory Visit: Admitting: Occupational Therapy

## 2024-02-13 ENCOUNTER — Ambulatory Visit: Admitting: Occupational Therapy

## 2024-02-15 ENCOUNTER — Ambulatory Visit: Admitting: Occupational Therapy

## 2024-02-16 ENCOUNTER — Ambulatory Visit: Payer: Self-pay | Admitting: Occupational Therapy

## 2024-02-16 DIAGNOSIS — R293 Abnormal posture: Secondary | ICD-10-CM

## 2024-02-16 DIAGNOSIS — R278 Other lack of coordination: Secondary | ICD-10-CM

## 2024-02-16 DIAGNOSIS — R29898 Other symptoms and signs involving the musculoskeletal system: Secondary | ICD-10-CM

## 2024-02-16 DIAGNOSIS — M25511 Pain in right shoulder: Secondary | ICD-10-CM

## 2024-02-16 DIAGNOSIS — M6281 Muscle weakness (generalized): Secondary | ICD-10-CM

## 2024-02-16 DIAGNOSIS — R29818 Other symptoms and signs involving the nervous system: Secondary | ICD-10-CM

## 2024-02-16 NOTE — Therapy (Signed)
 " OUTPATIENT OCCUPATIONAL THERAPY NEURO TREATMENT & PROGRESS NOTE  Patient Name: Patricia Blevins MRN: [id number] DOB:November 11, 1985, 39 y.o., adult Today's Date: 02/16/2024  PCP: Patricia Darice CROME, MD  REFERRING PROVIDER: [name], MD  END OF SESSION:  OT End of Session - 02/16/24 1353     Visit Number 7    Number of Visits 13   including eval   Date for Recertification  03/25/24    Authorization Type [facility] Medicaid    Authorization Time Period 12/16-3/15/26    Authorization - Visit Number 6    Authorization - Number of Visits 11    OT Start Time 1352    OT Stop Time 1500    OT Time Calculation (min) 68 min    Equipment Utilized During Treatment Testing Material, K tape    Activity Tolerance Patient tolerated treatment well    Behavior During Therapy WFL for tasks assessed/performed         Past Medical History:  Diagnosis Date   Anxiety    Asthma    Depression    Dermatomyositis (HCC)    Family history of breast cancer    Family history of melanoma    Long COVID    Melanoma (HCC)    Pneumonia    Substance abuse (HCC)    No drink since October   Past Surgical History:  Procedure Laterality Date   CYSTOSCOPY N/A 12/29/2020   Procedure: CYSTOSCOPY;  Surgeon: Bettina Muskrat, MD;  Location: [facility] OR;  Service: Gynecology;  Laterality: N/A;   lymph node removal     melanoma removal  2008-2009   SKIN BIOPSY  2021   Between breast for psoriasis   THIGH / KNEE SOFT TISSUE BIOPSY Right 2000   TOTAL LAPAROSCOPIC HYSTERECTOMY WITH SALPINGECTOMY N/A 12/29/2020   Procedure: TOTAL LAPAROSCOPIC HYSTERECTOMY WITH BILATERAL SALPINGECTOMY;  Surgeon: Bettina Muskrat, MD;  Location: [facility] OR;  Service: Gynecology;  Laterality: N/A;   Patient Active Problem List   Diagnosis Date Noted   Genetic testing 03/18/2023   Family history of breast cancer    Family history of melanoma    Abnormal uterine bleeding 12/29/2020   S/P laparoscopic hysterectomy 12/29/2020   Vitamin D  deficiency  01/09/2019   Elevated BP without diagnosis of hypertension 01/09/2019   Taking multiple medications for chronic disease 01/09/2019   Acute upper respiratory infection 10/05/2017   Left otitis media 10/05/2017   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   Long-term use of immunosuppressant medication 03/05/2016   Multiple benign nevi of upper extremity, lower extremity, and trunk 03/05/2016   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/05/2016   Candidiasis 01/22/2016   History of melanoma 09/12/2015   Lymphedema of upper extremity following lymphadenectomy 07/31/2015   Leg weakness, bilateral 07/31/2015   Metastatic melanoma (HCC) 12/22/2010   PM (polymyositis) (HCC) 12/22/2010   ONSET DATE: 11/28/2023 (Date of referral)  REFERRING DIAG: Polymyositis M33.20   THERAPY DIAG:  Muscle weakness (generalized)  Other lack of coordination  Right shoulder pain, unspecified chronicity  Other symptoms and signs involving the nervous system  Other symptoms and signs involving the musculoskeletal system  Abnormal posture  Rationale for Evaluation and Treatment: Rehabilitation  SUBJECTIVE:   SUBJECTIVE STATEMENT:  Pt reports they saw plastic surgeon re: breast reduction and they need therapy notes faxed to them.  Pt provided info as follows: [facility]: Ph: [phone], Fax: [phone]  Discussed progress to date due to need for updated plan of care and pt does  report improved awareness of energy conservation, joint protection and ability to carryover daily activities with increased ease but still in need of additional support from OT staff re: HEP, taping carryover and IADL management.  Pt accompanied by: self and family member - father Patricia Blevins (came for caregiver education at end of session)  PERTINENT HISTORY: PMH: anxiety, asthma, depression, dermatomyositis/polymyositis, melanoma, hx of substance abuse, lymphedema of RUE, long COVID, and  hysterectomy  PRECAUTIONS: Fall  WEIGHT BEARING RESTRICTIONS: No  PAIN:  Are you having pain? Yes: NPRS scale: 3/10 Pain location: low back and shoulders Pain description: discomfort Aggravating factors: prolonged use  Relieving factors: stretching, heat Repetitive, prolonged activities usually result in their arms giving out.  FALLS: Has patient fallen in last 6 months? No  LIVING ENVIRONMENT: 2 story home Lives with parents, ramp to enter, adaptive vehicle to ride in  Rollator, power chair, stair lift for getting upstairs, renovated bathroom widened door has a manual chair upstairs, uses a cane to get into bathroom, walk in shower, grab bars, toilet lift, standing in shower because unable to stand up from sitting without assistance Mechanical bed, SPC, rollator walker, powerchair    Handheld shower head, long handled sponge, reacher, bidet; table arm supports; shoulder support  PLOF: Independent with basic ADLs and Needs assistance with homemaking; on disability; does not drive  PATIENT GOALS: Improve use of hands/hand pain/continue with leisure activities Subjective note at eval - They were recommended to come into this clinic to work on issues with their hands. They like to paint and want to be able to continue with this. Is looking to build an accessible home in [place]. They question what recommendations therapy has for this. Is showering once a week but would like to do this more frequently. Showering is so exhausting to the point where it will be the only thing they do for the day. Texts throughout the day and worries this may be detrimental to their hands.   OBJECTIVE:  Note: Objective measures were completed at Evaluation unless otherwise noted.  HAND DOMINANCE: Right  ADLs: Overall ADLs: mod I  IADLs: Medication management: occasional setup Financial management: mod I Handwriting: 100% legible, Increased time, and unable to write anything more than a sentence  without fatigue  MOBILITY STATUS: I with power wheelchair  ACTIVITY TOLERANCE: Activity tolerance: poor  FUNCTIONAL OUTCOME MEASURES: PSFS: 12/20/23 Eval 3.7 PSFS: 02/16/24 Recert 6.3   Total score = sum of the activity scores/number of activities Minimum detectable change (90%CI) for average score = 2 points Minimum detectable change (90%CI) for single activity score = 3 points   UPPER EXTREMITY ROM:  70* shoulder flexion B; all other planes WFL for elbows, wrists, digits  UPPER EXTREMITY MMT:   BUE - BFL  HAND FUNCTION: 12/27/23 - Right 41.6, 40.1, 41.2 Left 54.2, 59.7, 55.7 Average: Right: 41.0 lbs Left: 56.5 lbs  02/16/24 Right 51.1, 50.7, 45.8, 42.1 Left 66.3, 67.0, 59.7, 62.1 Average Right 47.4 lbs; Left 63.8 lbs   COORDINATION: 12/27/23: Right 26.18 sec Left 28.30 sec  02/16/24: Right: 20.27 sec  Left: 22.67 sec  SENSATION: Eval: 12/20/23: Paresthesias reported in RUE 02/16/24 - normal by pt report ie) both sides feel the same  EDEMA: none reported or observed  MUSCLE TONE: N/T  COGNITION: Overall cognitive status: Within functional limits for tasks assessed  VISION: Subjective report: no changes reported Baseline vision: Wears glasses all the time Visual history: none  VISION ASSESSMENT: WFL  PERCEPTION: WFL  PRAXIS: WFL  OBSERVATIONS:  Pt independent with use of power wheelchair. The pt appears well kept and has glasses donned. Wrist positioning strays from neutral with functional use.                                                                                                                            TODAY'S TREATMENT:    - Self Care education and training and therapeutic activities completed for duration as noted below including:  Ongoing pt education provided re: energy conservation, joint protection and positioning.  Pt reports increased participation in showers at home s/p OT education to perform aspects of a full shower rather than performing  entire task which can be time and energy consuming ie) picking a day to wash their hair and then washing body/limbs on a different day.   Reviewed goals and explored need for updated goals r/t activities out of their WC at home for IADLS and home modification considerations ie) 2-in-1 washer/dryer combo to all pt to perform laundry with increased ease, along with suggestion to work on smaller loads of laundry to also save energy ie) of having to move laundry between machines, hang up multiple items or carry multiple items.  Therapist reviewed goals with patient and updated patient progression.  Pt has increased strength and FM coordination/speed bilaterally per testing conducted today (see objective note and goal section for grip strengths and times for 9 hole peg test).    In addition pt reports implementing multiple joint protection and energy conservation principles with improved ease with areas identified at eval as difficult ie) pt also notes improvement in their ability to paint/draw, write and bathe per PSFS ratings improved from 3-4 up to 6-7 with overall improvement from 3.7 to 6.3/10 overall score.  Neuromuscular reeducation - provided via Kinesio taping to support the neuromuscular system and promote functional positioning and movement of L UE for activities with decreased pain complaints. Kinesio Taping (KT) technique applied to B shoulders to address glenohumeral instability and pain. Tape applied using mechanical correction and space correction techniques with moderate tension to facilitate humeral head approximation toward the glenoid fossa, provide external support to limit downward displacement due to gravity and muscle weakness and improve proprioceptive input for posture and positioning during seated activities.  Taping pattern included: Middle deltoid strip for stabilization, posterior deltoid strip with 30-50% tension to support shoulder approximation and anterior deltoid stirp for  superior shoulder capsule positioning to address pain and poor positioning otherwise.  Patient tolerated application well and reported good comfort prior to leaving therapy.  Education provided to pt's father who was able to apply tape to R UE s/p observing OT apply to LUE.    PATIENT EDUCATION: Education details: Progress in OT, updated goals for ongoing therapy, KT tape application Person educated: Patient and Parent Education method: Explanation, Demonstration, Tactile cues, and Verbal cues Education comprehension: verbalized understanding, returned demonstration, verbal cues required, tactile cues required, and needs further education  HOME EXERCISE PROGRAM: 12/27/23:  Bed positioning, Ktape trial 01/03/24: Tendon Glides/Putty HEP Access Code: [email or web address] 01/11/24: Coordination Activities 01/19/24: Joint protection, Energy Conservation, AE considerations 02/02/2024: additional EC strategies  GOALS:  SHORT TERM GOALS: Target date: 01/18/2024    Patient will demonstrate initial UE HEP with 25% verbal cues or less for proper execution. Baseline: Goal status: MET  2.  Patient will demonstrate at least 45+ lbs dominant RUE grip strength as needed to open jars and other containers. Baseline: Right: 41.0 lbs Left: 56.5 lbs Goal status: MET 02/16/24: Average Right 47.4 lbs; Left 63.8 lbs  3.  Patient will demo improved FM coordination as evidenced by completing nine-hole peg with use of BUE in 25 seconds or less. Baseline: Right 26.18 sec Left 28.30 sec  Goal status: MET 02/16/24: Right: 20.27 se  Left: 22.67 sec  4.  Pt will independently recall at least 3 joint protection, ergonomics, and body mechanic principles as noted in pt instructions.   Baseline:  Goal status: MET  5.  Patient will independently recall at least 3 energy conservation principles in relation to ADLs to increase functional independence.  Baseline:  Goal status: MET   LONG TERM GOALS: Target date: 02/16/2024  Patient  will demonstrate updated UE HEP with visual handouts only for proper execution. Baseline:  Goal status: MET 02/16/24 Tendon glides, putty and coordination HEPs  2.  Patient will report at least two-point increase in average PSFS score or at least three-point increase in a single activity score indicating functionally significant improvement given minimum detectable change. Baseline: 3.7 total score (See above for individual activity scores)  Goal status: MET 02/16/24 Recert 6.3  UPDATED GOALS: Target Date 03/25/24  1. Patient will demonstrate updated LUE HEP (including task-specific strengthening, postural control, and tone management activities) with visual handouts only, to support continued functional abilities in/out of WC.  Baseline: Comfortable with Tendon glides, putty and coordination HEPs  Goal Status: In Progress  2. Patient will complete a simulated or real-life IADL task out of the wheelchair (e.g., simple meal prep, laundry management, item retrieval or transport) using BUEs with appropriate body mechanics and good comfort/safety, to promote safe home management participation.  Baseline: Concerns re: carrying objects  Goal Status: NEW  3. Patient and caregiver will independently demonstrate appropriate application of K-taping and/or shoulder brace for the BUE to support shoulder alignment, minimize subluxation, and improve postural comfort during functional activities.  Baseline: Education and demonstration initiated  Goal Status: In Progress  4. Patient will verbalize and identify at least 5 home modification strategies to support wheelchair-accessible living in their new home (e.g., kitchen layout, laundry access, work surface height, reach ranges), demonstrating understanding of safety and independence needs.  Baseline: Education discussion initiated  Goal Status: In Progress    ASSESSMENT:  CLINICAL IMPRESSION: Patient is a 39 y.o. adult who was seen today for  occupational therapy treatment for polymyositis with B hand/shoulder pain and weakness R>L and abnormal posture affecting comfort and activity tolerance.  This 7th progress note is for dates: 12/20/23 to 02/16/2024. Pt has met 5/5 STGs and 2/2 LTGs. Pt made progress towards goals as expected and continues to benefit from skilled OT services in the outpatient setting with 4 additional goals added to work towards max rehab impact for ADLs and IADLS. PERFORMANCE DEFICITS: in functional skills including ADLs, IADLs, coordination, ROM, strength, pain, Fine motor control, Gross motor control, mobility, body mechanics, endurance, decreased knowledge of use of DME, and UE functional use.   IMPAIRMENTS: are limiting patient from  ADLs, IADLs, and leisure.   CO-MORBIDITIES: may have co-morbidities  that affects occupational performance. Patient will benefit from skilled OT to address above impairments and improve overall function.  REHAB POTENTIAL: Good  PLAN:  OT FREQUENCY: 1x/week  OT DURATION: additional 4 sessions per approved visits remaining  PLANNED INTERVENTIONS: 97168 OT Re-evaluation, 97535 self care/ADL training, 02889 therapeutic exercise, 97530 therapeutic activity, 97112 neuromuscular re-education, 97140 manual therapy, 97035 ultrasound, 97018 paraffin, 02960 fluidotherapy, 97010 moist heat, 97760 Orthotic Initial, 97763 Orthotic/Prosthetic subsequent, energy conservation, coping strategies training, patient/family education, and DME and/or AE instructions  RECOMMENDED OTHER SERVICES: N/A for this visit (receives aquatic therapy at another clinic)  CONSULTED AND AGREED WITH PLAN OF CARE: Patient  PLAN FOR NEXT SESSION:  Modify brace wearing schedule Complete caregiver education re: application of Ktape RUE & LUE and Video   Check and update on HEPs - resistance foam, putty, coordination activities etc Home modifications education  Standing for IADL training   Clarita LITTIE Pride,  OT 02/16/2024, 3:13 PM  "

## 2024-03-01 ENCOUNTER — Ambulatory Visit: Admitting: Occupational Therapy

## 2024-03-01 ENCOUNTER — Ambulatory Visit: Admitting: Internal Medicine

## 2024-03-05 ENCOUNTER — Ambulatory Visit: Admitting: Internal Medicine

## 2024-03-06 ENCOUNTER — Ambulatory Visit: Payer: Self-pay | Admitting: Occupational Therapy

## 2024-03-13 ENCOUNTER — Ambulatory Visit: Payer: Self-pay | Admitting: Occupational Therapy

## 2024-03-20 ENCOUNTER — Ambulatory Visit: Payer: Self-pay | Admitting: Occupational Therapy

## 2024-04-10 ENCOUNTER — Institutional Professional Consult (permissible substitution): Admitting: Plastic Surgery

## 2025-01-08 ENCOUNTER — Inpatient Hospital Stay: Admitting: Adult Health
